# Patient Record
Sex: Female | Born: 1945 | Race: Black or African American | Hispanic: No | Marital: Married | Smoking: Former smoker
Health system: Southern US, Community
[De-identification: ages and names within clinical notes are randomized; demographics above are authoritative.]

## PROBLEM LIST (undated history)

## (undated) DIAGNOSIS — E785 Hyperlipidemia, unspecified: Secondary | ICD-10-CM

## (undated) DIAGNOSIS — T8859XA Other complications of anesthesia, initial encounter: Secondary | ICD-10-CM

## (undated) DIAGNOSIS — I739 Peripheral vascular disease, unspecified: Secondary | ICD-10-CM

## (undated) DIAGNOSIS — I10 Essential (primary) hypertension: Secondary | ICD-10-CM

## (undated) DIAGNOSIS — I6529 Occlusion and stenosis of unspecified carotid artery: Secondary | ICD-10-CM

## (undated) DIAGNOSIS — M1990 Unspecified osteoarthritis, unspecified site: Secondary | ICD-10-CM

## (undated) DIAGNOSIS — T4145XA Adverse effect of unspecified anesthetic, initial encounter: Secondary | ICD-10-CM

## (undated) DIAGNOSIS — Z8701 Personal history of pneumonia (recurrent): Secondary | ICD-10-CM

## (undated) HISTORY — PX: TONSILLECTOMY: SUR1361

## (undated) HISTORY — DX: Hyperlipidemia, unspecified: E78.5

## (undated) HISTORY — DX: Peripheral vascular disease, unspecified: I73.9

## (undated) HISTORY — PX: OOPHORECTOMY: SHX86

## (undated) HISTORY — PX: CYST REMOVAL HAND: SHX6279

## (undated) HISTORY — PX: CATARACT EXTRACTION W/ INTRAOCULAR LENS IMPLANT: SHX1309

## (undated) HISTORY — PX: ANKLE SURGERY: SHX546

## (undated) HISTORY — DX: Essential (primary) hypertension: I10

## (undated) HISTORY — DX: Occlusion and stenosis of unspecified carotid artery: I65.29

## (undated) HISTORY — PX: COLONOSCOPY: SHX174

## (undated) HISTORY — PX: APPENDECTOMY: SHX54

---

## 2003-12-29 ENCOUNTER — Ambulatory Visit (HOSPITAL_BASED_OUTPATIENT_CLINIC_OR_DEPARTMENT_OTHER): Admission: RE | Admit: 2003-12-29 | Discharge: 2003-12-29 | Payer: Self-pay | Admitting: Orthopedic Surgery

## 2003-12-29 ENCOUNTER — Ambulatory Visit (HOSPITAL_COMMUNITY): Admission: RE | Admit: 2003-12-29 | Discharge: 2003-12-29 | Payer: Self-pay | Admitting: Orthopedic Surgery

## 2010-10-18 ENCOUNTER — Ambulatory Visit (HOSPITAL_BASED_OUTPATIENT_CLINIC_OR_DEPARTMENT_OTHER)
Admission: RE | Admit: 2010-10-18 | Discharge: 2010-10-18 | Disposition: A | Discharge status: Home / Self Care | Payer: Commercial Indemnity | Source: Ambulatory Visit | Attending: Orthopedic Surgery | Admitting: Orthopedic Surgery

## 2010-10-18 DIAGNOSIS — M23329 Other meniscus derangements, posterior horn of medial meniscus, unspecified knee: Secondary | ICD-10-CM | POA: Insufficient documentation

## 2010-10-18 DIAGNOSIS — M2340 Loose body in knee, unspecified knee: Secondary | ICD-10-CM | POA: Insufficient documentation

## 2010-10-18 DIAGNOSIS — M23302 Other meniscus derangements, unspecified lateral meniscus, unspecified knee: Secondary | ICD-10-CM | POA: Insufficient documentation

## 2010-10-18 DIAGNOSIS — Z01812 Encounter for preprocedural laboratory examination: Secondary | ICD-10-CM | POA: Insufficient documentation

## 2010-10-18 DIAGNOSIS — M2240 Chondromalacia patellae, unspecified knee: Secondary | ICD-10-CM | POA: Insufficient documentation

## 2010-10-18 LAB — POCT HEMOGLOBIN-HEMACUE: Hemoglobin: 14.2 g/dL (ref 12.0–15.0)

## 2010-10-26 NOTE — Op Note (Signed)
NAME:  Sylvia Rodriguez, Sylvia Rodriguez NO.:  [id number]  MEDICAL RECORD NO.:  [id number]  LOCATION:                                 FACILITY:  PHYSICIAN:  Feliberto Gottron. Turner Daniels, M.D.        DATE OF BIRTH:  DATE OF PROCEDURE:  10/18/2010 DATE OF DISCHARGE:                              OPERATIVE REPORT   PREOPERATIVE DIAGNOSES:  Left knee meniscal tear, chondromalacia, and possible loose bodies.  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  PROCEDURE:  Partial arthroscopic medial greater than lateral meniscectomy, removal of multiple cartilaginous loose bodies and debridement chondromalacia grade 3 focal grade 4 with abrasion arthroplasty, medial femoral condyle and trochlea.  SURGEON:  Feliberto Gottron. Turner Daniels, MD  FIRST ASSISTANT:  Shirl Harris PA-C.  ANESTHETIC:  General LMA.  ESTIMATED BLOOD LOSS:  Minimal.  FLUID REPLACEMENT:  800 mL crystalloid.  DRAINS PLACED:  None.  TOURNIQUET TIME:  None.  INDICATIONS FOR PROCEDURE:  A 65 year old woman with catching, popping and pain in her left knee.  Plain radiographs, good maintenance of articular cartilage height does not look like she will need a knee arthroplasty.  She has failed conserve treatment, anti-inflammatory medicines, exercises, good temporary relief from a cortisone injection, but she once again has no fusion with pain along the medial and lateral joint lines and she reports catching, popping and buckling.  She desires elective arthroscopic evaluation and treatment of her left knee.  Risks and benefits of surgery have been discussed.  Questions answered.  DESCRIPTION OF PROCEDURE:  The patient identified by armband and taken to operating room #1 [facility].  Appropriate anesthetic monitors were attached.  General LMA anesthesia  induced with the patient in supine position.  Lateral post applied to the table and the left lower extremity was prepped and draped in usual sterile fashion from the ankle to the midthigh.  Time-out procedure performed.  Using a #11-blade standard inferomedial, inferolateral peripatellar portals were made allowing introduction of the arthroscope through the inferolateral portal pump pressure kept at 100 mmHg.  Diagnostic arthroscopy did indeed reveal multiple cartilaginous loose bodies in the joint fluid that was taken out with a sucker shaver or a small basket forceps. Moving into the patellofemoral joint flap tears of the trochlear were identified and debrided back to stable margins grade 3 to grade 4 chondromalacia.  The patella itself had grade 2-3 chondromalacia and was also lightly debrided.  Moving the medial compartment, identified complex tearing of the medial meniscus that was quite extensive and was debrided back to stable margin with a small biter, a large biter, upbiter and a 3.5 Gator sucker shaver.  The patient also had grade 3 to focal grade 4 chondromalacia with flap tears of the posterior and distal aspects of the medial femoral condyle and these were also smoothed and debrided to a stable margin.  The ACL and the PCL were intact.  On the lateral side, we found a couple more cartilaginous loose bodies that were taken through the outflow or with the sucker shaver.  The patient also had degenerative tearing of the lateral meniscus and this was debrided back to stable margin as well.  At this point, the knee was irrigated out with normal saline solution and we then instilled 3 mL of 0.5% Marcaine with epinephrine in the each portal subcutaneous tissue using a syringe and a 22 x 1/2-inch needle.  The rest of the 14 mL was then instilled into the joint itself as a local anesthetic.  Dressing of Xeroform, 4x4 dressing sponges, Webril and Ace wrap applied.  The patient was  awakened, extubated and taken to the recovery room without difficulty.     Feliberto Gottron. Turner Daniels, M.D.     Ovid Curd  D:  10/18/2010  T:  10/19/2010  Job:  [id number]  Electronically Signed by Gean Birchwood M.D. on 10/25/2010 11:10:15 PM

## 2015-11-10 LAB — ABI

## 2015-12-22 ENCOUNTER — Other Ambulatory Visit: Payer: Self-pay | Admitting: Vascular Surgery

## 2016-01-12 ENCOUNTER — Encounter: Payer: Self-pay | Admitting: Vascular Surgery

## 2016-01-19 ENCOUNTER — Ambulatory Visit (INDEPENDENT_AMBULATORY_CARE_PROVIDER_SITE_OTHER): Payer: BLUE CROSS/BLUE SHIELD | Admitting: Vascular Surgery

## 2016-01-19 ENCOUNTER — Encounter: Payer: Self-pay | Admitting: Vascular Surgery

## 2016-01-19 VITALS — BP 90/47 | HR 84 | Temp 97.8°F | Resp 16 | Ht 62.0 in | Wt 114.0 lb

## 2016-01-19 DIAGNOSIS — I70213 Atherosclerosis of native arteries of extremities with intermittent claudication, bilateral legs: Secondary | ICD-10-CM

## 2016-01-19 DIAGNOSIS — R0989 Other specified symptoms and signs involving the circulatory and respiratory systems: Secondary | ICD-10-CM

## 2016-01-19 NOTE — Progress Notes (Signed)
[facility] [place]  Patient name: Sylvia Rodriguez MRN: [id number] DOB: 08/18/1945 Sex: female  REASON FOR VISIT: Claudication, referred by Dr. Lollie Marrowaymond Rosario  HPI: Sylvia Mantislizabeth Jarchow is a 70 y.o. 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 She is active and likes to walk in the park, but her leg cramps have been limiting her walking distance. She states that this is her fearing with her daily activities. She also complains of bilateral thigh pain that she describes as a knife stabbing her in the leg. This occurs randomly at rest and also with getting out of bed. The started sometime in the past few months. When questioned, she does have buttock pain as well. She does not have low back pain or shooting pains down her leg. She denies any paresthesias. She denies any nonhealing wounds or rest pain.   There is no history of cardiac disease. She denies any chest pain or shortness of breath. She does describe an episode years ago where her left arm felt very heavy. She underwent a stroke workup was negative. She is a former smoker quitting 5 years ago. Her hypertension is managed on amlodipine. She takes Plavix daily. Her hyperlipidemia is managed on a statin. She is not diabetic.   SOCIAL HISTORY: Social History   Social History  . Marital status: Married    Spouse name: N/A  . Number of children: N/A  . Years of education: N/A   Occupational History  . Not on file.   Social History Main Topics  . Smoking status: Former Smoker    Quit date: 01/19/2011  . Smokeless tobacco: Never Used  . Alcohol use Not on file  . Drug use: Unknown  . Sexual activity: Not on file   Other Topics Concern  . Not on file   Social History Narrative  . No narrative on file    Allergies  Allergen Reactions  . Codeine   . Penicillins     Current Outpatient Prescriptions  Medication Sig Dispense Refill  . amLODipine (NORVASC)  5 MG tablet Take 5 mg by mouth daily.    Marland Kitchen. atorvastatin (LIPITOR) 40 MG tablet Take 40 mg by mouth daily.    . clopidogrel (PLAVIX) 75 MG tablet Take 75 mg by mouth daily.    . diazepam (VALIUM) 2 MG tablet Take 2 mg by mouth every 6 (six) hours as needed for anxiety.    . diclofenac sodium (VOLTAREN) 1 % GEL Apply topically daily as needed.    . doxycycline (VIBRAMYCIN) 100 MG capsule Take 100 mg by mouth 2 (two) times daily.    Marland Kitchen. omeprazole (PRILOSEC) 40 MG capsule Take 40 mg by mouth daily.    . pentoxifylline (TRENTAL) 400 MG CR tablet Take 400 mg by mouth 3 (three) times daily with meals.    . traMADol (ULTRAM) 50 MG tablet Take by mouth every 6 (six) hours as needed.     No current facility-administered medications for this visit.     REVIEW OF SYSTEMS:  [X]  denotes positive finding, [ ]  denotes negative finding Cardiac  Comments:  Chest pain or chest pressure:    Shortness of breath upon exertion:    Short of breath when lying flat:    Irregular heart rhythm:        Vascular    Pain in calf, thigh, or hip brought on by ambulation: x   Pain in feet at night that wakes you  up from your sleep:     Blood clot in your veins:    Leg swelling:         Pulmonary    Oxygen at home:    Productive cough:     Wheezing:         Neurologic    Sudden weakness in arms or legs:     Sudden numbness in arms or legs:     Sudden onset of difficulty speaking or slurred speech:    Temporary loss of vision in one eye:     Problems with dizziness:         Gastrointestinal    Blood in stool:     Vomited blood:         Genitourinary    Burning when urinating:     Blood in urine:        Psychiatric    Major depression:         Hematologic    Bleeding problems:    Problems with blood clotting too easily:        Skin    Rashes or ulcers:        Constitutional    Fever or chills:      PHYSICAL EXAM: Vitals:   01/19/16 1456  BP: (!) 90/47  Pulse: 84  Resp: 16  Temp: 97.8 F  (36.6 C)  TempSrc: Oral  SpO2: 99%  Weight: 114 lb (51.7 kg)  Height: 5\' 2"  (1.575 m)    GENERAL: The patient is a well-nourished female, in no acute distress. The vital signs are documented above. CARDIAC: There is a regular rate and rhythm. Right carotid bruit. VASCULAR:  Nonpalpable femoral pulses or pedal pulses. Feet appear warm and well perfused. PULMONARY: There is good air exchange bilaterally without wheezing or rales.  MUSCULOSKELETAL: There are no major deformities or cyanosis. NEUROLOGIC: No focal weakness or paresthesias are detected. SKIN: There are no ulcers or rashes noted. PSYCHIATRIC: The patient has a normal affect.  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   ABIs from [facility] on 11/10/2015 are 0.59 bilaterally.  MEDICAL ISSUES: Bilateral lower extremity claudication  The patient has had a one-year history of progressive bilateral calf claudication, left worse than right. 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 She states that her claudication is intolerable. Plan for an arteriogram with possible intervention. This will be scheduled in the near future with Dr. Randie Heinz. Discussed that she may require further surgery if percutaneous intervention is not successful.   Discussed that her bilateral thigh pain is unlikely related to blood flow issues given that the symptoms occur randomly and also at rest.  Right carotid bruit  Plan for carotid duplex at patient's convenience. She has been asymptomatic.  Maris Berger, PA-C [facility] [place] [phone]  Very active 70 year old with severely limiting bilateral lower extremity claudication. 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 Discussed option of arteriogram for further evaluation. Explained may be able to cross total occlusion and if so potentially could treat this with the endovascular means. Also explained the possible need for brachial artery puncture for full visualization of her arterial tree. Have scheduled this at her convenience with Dr. Randie Heinz as an outpatient I have examined the patient, reviewed and agree with  above.Gretta Began, MD 01/19/2016 4:37 PM

## 2016-01-20 ENCOUNTER — Other Ambulatory Visit: Payer: Self-pay

## 2016-01-27 ENCOUNTER — Ambulatory Visit (HOSPITAL_COMMUNITY)
Admission: RE | Admit: 2016-01-27 | Discharge: 2016-01-27 | Disposition: A | Discharge status: Home / Self Care | Payer: BLUE CROSS/BLUE SHIELD | Source: Ambulatory Visit | Attending: Vascular Surgery | Admitting: Vascular Surgery

## 2016-01-27 ENCOUNTER — Encounter (HOSPITAL_COMMUNITY)
Admission: RE | Disposition: A | Discharge status: Home / Self Care | Payer: Self-pay | Source: Ambulatory Visit | Attending: Vascular Surgery

## 2016-01-27 DIAGNOSIS — Z87891 Personal history of nicotine dependence: Secondary | ICD-10-CM | POA: Diagnosis not present

## 2016-01-27 DIAGNOSIS — I70213 Atherosclerosis of native arteries of extremities with intermittent claudication, bilateral legs: Secondary | ICD-10-CM | POA: Diagnosis not present

## 2016-01-27 DIAGNOSIS — Z7902 Long term (current) use of antithrombotics/antiplatelets: Secondary | ICD-10-CM | POA: Insufficient documentation

## 2016-01-27 HISTORY — PX: PERIPHERAL VASCULAR CATHETERIZATION: SHX172C

## 2016-01-27 LAB — POCT I-STAT, CHEM 8
BUN: 29 mg/dL — AB (ref 6–20)
CREATININE: 0.8 mg/dL (ref 0.44–1.00)
Calcium, Ion: 1.17 mmol/L (ref 1.15–1.40)
Chloride: 99 mmol/L — ABNORMAL LOW (ref 101–111)
GLUCOSE: 90 mg/dL (ref 65–99)
HEMATOCRIT: 37 % (ref 36.0–46.0)
Hemoglobin: 12.6 g/dL (ref 12.0–15.0)
POTASSIUM: 5 mmol/L (ref 3.5–5.1)
Sodium: 138 mmol/L (ref 135–145)
TCO2: 33 mmol/L (ref 0–100)

## 2016-01-27 SURGERY — ABDOMINAL AORTOGRAM W/LOWER EXTREMITY
Anesthesia: LOCAL

## 2016-01-27 MED ORDER — HEPARIN (PORCINE) IN NACL 2-0.9 UNIT/ML-% IJ SOLN
INTRAMUSCULAR | Status: AC
  Filled 2016-01-27: qty 500

## 2016-01-27 MED ORDER — IODIXANOL 320 MG/ML IV SOLN
INTRAVENOUS | Status: DC | PRN
  Administered 2016-01-27: 95 mL via INTRAVENOUS

## 2016-01-27 MED ORDER — FENTANYL CITRATE (PF) 100 MCG/2ML IJ SOLN
INTRAMUSCULAR | Status: AC
  Filled 2016-01-27: qty 2

## 2016-01-27 MED ORDER — ACETAMINOPHEN 325 MG PO TABS: 650.0000 mg | ORAL_TABLET | ORAL | Status: DC | PRN

## 2016-01-27 MED ORDER — LIDOCAINE HCL (PF) 1 % IJ SOLN
INTRAMUSCULAR | Status: DC | PRN
  Administered 2016-01-27 (×2): 22 mL

## 2016-01-27 MED ORDER — HEPARIN (PORCINE) IN NACL 2-0.9 UNIT/ML-% IJ SOLN
INTRAMUSCULAR | Status: DC | PRN
  Administered 2016-01-27: 1000 mL

## 2016-01-27 MED ORDER — SODIUM CHLORIDE 0.9 % IV SOLN: Freq: Once | INTRAVENOUS | Status: DC

## 2016-01-27 MED ORDER — LIDOCAINE HCL (PF) 1 % IJ SOLN
INTRAMUSCULAR | Status: AC
  Filled 2016-01-27: qty 30

## 2016-01-27 MED ORDER — MIDAZOLAM HCL 2 MG/2ML IJ SOLN
INTRAMUSCULAR | Status: DC | PRN
  Administered 2016-01-27: 1 mg via INTRAVENOUS

## 2016-01-27 MED ORDER — MIDAZOLAM HCL 2 MG/2ML IJ SOLN
INTRAMUSCULAR | Status: AC
  Filled 2016-01-27: qty 2

## 2016-01-27 MED ORDER — SODIUM CHLORIDE 0.9 % IV SOLN
INTRAVENOUS | Status: DC
  Administered 2016-01-27: 07:00:00 via INTRAVENOUS

## 2016-01-27 MED ORDER — FENTANYL CITRATE (PF) 100 MCG/2ML IJ SOLN
INTRAMUSCULAR | Status: DC | PRN
  Administered 2016-01-27: 50 ug via INTRAVENOUS
  Administered 2016-01-27 (×2): 25 ug via INTRAVENOUS

## 2016-01-27 SURGICAL SUPPLY — 13 items
CATH ANGIO 5F BER2 65CM (CATHETERS) ×2 IMPLANT
CATH CROSS OVER TEMPO 5F (CATHETERS) ×2 IMPLANT
CATH OMNI FLUSH 5F 65CM (CATHETERS) ×2 IMPLANT
DEVICE TORQUE .025-.038 (MISCELLANEOUS) ×2 IMPLANT
GUIDEWIRE ANGLED .035X150CM (WIRE) ×2 IMPLANT
KIT MICROINTRODUCER STIFF 5F (SHEATH) ×2 IMPLANT
KIT PV (KITS) ×2 IMPLANT
SHEATH PINNACLE 5F 10CM (SHEATH) ×4 IMPLANT
SYR MEDRAD MARK V 150ML (SYRINGE) ×2 IMPLANT
TRANSDUCER W/STOPCOCK (MISCELLANEOUS) ×2 IMPLANT
TRAY PV CATH (CUSTOM PROCEDURE TRAY) ×2 IMPLANT
WIRE BENTSON .035X145CM (WIRE) ×2 IMPLANT
WIRE TORQFLEX AUST .018X40CM (WIRE) ×2 IMPLANT

## 2016-01-27 NOTE — Op Note (Signed)
    Patient name: Sylvia Rodriguez MRN: [id number] DOB: [birth year] Sex: female  01/27/2016 Pre-operative Diagnosis: claudication Post-operative diagnosis:  Same Surgeon:  Lemar LivingsBrandon Malaiya Paczkowski Procedure Performed:  1.  US guided cannulation bilateral common femoral arteries  2.  Aortogram   Indications:   70 year old female former smoker on statin Plavix with bilateral lower extremity claudication left greater than right. 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 She has limitations to her daily activities and was therefore indicated for the above procedure with possible intervention.  Findings: The right external iliac artery was occluded for its entiretya patent common iliac artery on that side. The left common iliac artery was occluded. The bifurcation of the aorta was very narrowed. We were able to cross the occlusion on the right than on the left side there was dissection plane into the aorta and we therefore aborted. Completion aortogram demonstrated continued filling of the IMA and lumbar arteries with similar runoff to the groins as prior to attempted intervention.  Procedure:  The patient was identified in the holding area and taken to room 8.  The patient was then placed supine on the table and prepped and draped in the usual sterile fashion.  A time out was called.  Ultrasound was used to evaluate the right common femoral artery.  It was patent .  A digital ultrasound image was acquired.  A micropuncture needle was used to access the right common femoral artery under ultrasound guidance.  An 018 wire was advanced without resistance and a micropuncture sheath was placed.  The 018 wire was removed and a benson wire was placed.  The micropuncture sheath was exchanged for a 5 french sheath.  An omniflush catheter was advanced over the wire to the level of L-1.  An abdominal angiogram was obtained with findings of occluded right external iliac and  occluded left common iliac artery. With this in mind we then turned our attention towards the left groin. Ultrasound was used to identify the left common femoral artery using micropuncture needle and over a wire was passed and exchanged for 5 [place] sheath. From the left side we then used a combination of pericatheter Glidewire to cross the occlusion. Contrast demonstrated we had communication between the left and right also had dissection into the aorta. Given the patient's symptoms of claudication and young age I thought that she would be better served with aortobifemoral bypass if she needed intervention at this time. We therefore removed our catheter and wire from the left performed aortogram from the right with findings demonstrated above. Satisfied all catheters and wires were removed she is left in place discussion will be had regarding aortobifemoral bypass.   Contrast 95cc  Sedation: 52 minutes    Cherylanne Ardelean C. Randie Heinzain, MD [facility] [place] Office: [phone] Pager: [phone]

## 2016-01-27 NOTE — Discharge Instructions (Signed)
Angiogram, Care After °Refer to this sheet in the next few weeks. These instructions provide you with information about caring for yourself after your procedure. Your health care provider may also give you more specific instructions. Your treatment has been planned according to current medical practices, but problems sometimes occur. Call your health care provider if you have any problems or questions after your procedure. °WHAT TO EXPECT AFTER THE PROCEDURE °After your procedure, it is typical to have the following: °· Bruising at the catheter insertion site that usually fades within 1-2 weeks. °· Blood collecting in the tissue (hematoma) that may be painful to the touch. It should usually decrease in size and tenderness within 1-2 weeks. °HOME CARE INSTRUCTIONS °· Take medicines only as directed by your health care provider. °· You may shower 24-48 hours after the procedure or as directed by your health care provider. Remove the bandage (dressing) and gently wash the site with plain soap and water. Pat the area dry with a clean towel. Do not rub the site, because this may cause bleeding. °· Do not take baths, swim, or use a hot tub until your health care provider approves. °· Check your insertion site every day for redness, swelling, or drainage. °· Do not apply powder or lotion to the site. °· Do not lift over 10 lb (4.5 kg) for 5 days after your procedure or as directed by your health care provider. °· Ask your health care provider when it is okay to: °¨ Return to work or school. °¨ Resume usual physical activities or sports. °¨ Resume sexual activity. °· Do not drive home if you are discharged the same day as the procedure. Have someone else drive you. °· You may drive 24 hours after the procedure unless otherwise instructed by your health care provider. °· Do not operate machinery or power tools for 24 hours after the procedure or as directed by your health care provider. °· If your procedure was done as an  outpatient procedure, which means that you went home the same day as your procedure, a responsible adult should be with you for the first 24 hours after you arrive home. °· Keep all follow-up visits as directed by your health care provider. This is important. °SEEK MEDICAL CARE IF: °· You have a fever. °· You have chills. °· You have increased bleeding from the catheter insertion site. Hold pressure on the site.  CALL 911 °SEEK IMMEDIATE MEDICAL CARE IF: °· You have unusual pain at the catheter insertion site. °· You have redness, warmth, or swelling at the catheter insertion site. °· You have drainage (other than a small amount of blood on the dressing) from the catheter insertion site. °· The catheter insertion site is bleeding, and the bleeding does not stop after 30 minutes of holding steady pressure on the site. °· The area near or just beyond the catheter insertion site becomes pale, cool, tingly, or numb. °  °This information is not intended to replace advice given to you by your health care provider. Make sure you discuss any questions you have with your health care provider. °  °Document Released: 11/18/2004 Document Revised: 05/23/2014 Document Reviewed: 10/03/2012 °Elsevier Interactive Patient Education ©2016 [employer] ° °

## 2016-01-27 NOTE — H&P (Signed)
     History and Physical Update  The patient was interviewed and re-examined.  The patient's previous History and Physical has been reviewed and is unchanged from Dr. Bosie HelperEarly's office. Continue plan of aortogram with ble runoff and possible iliac artery stenting bilaterally, possibly from arm access.   Nayelis Bonito C. Randie Heinzain, MD [facility] [place] Office: [phone] Pager: [phone]   01/27/2016, 8:14 AM

## 2016-01-27 NOTE — Progress Notes (Signed)
Site area: lt groin sheath pulled by Army MeliaLaura murphy Site Prior to Removal:  Level  0 Pressure Applied For:  20 minutes Manual:   yes Patient Status During Pull:  stable Post Pull Site:  Level  0 Post Pull Instructions Given:  yes Post Pull Pulses Present: yes Dressing Applied:  tegaderm Bedrest begins @   1035 Comments:

## 2016-01-27 NOTE — Progress Notes (Signed)
Site area: rt groin fa sheath Site Prior to Removal:  Level 0 Pressure Applied For:  20 minutes Manual:   yes Patient Status During Pull:  stable Post Pull Site:  Level  0 Post Pull Instructions Given:  yes Post Pull Pulses Present: yes Dressing Applied:  tegaderm Bedrest begins @  Comments:   

## 2016-01-28 ENCOUNTER — Encounter (HOSPITAL_COMMUNITY): Payer: Self-pay | Admitting: Vascular Surgery

## 2016-02-09 ENCOUNTER — Encounter: Payer: Self-pay | Admitting: Family Medicine

## 2016-02-19 ENCOUNTER — Ambulatory Visit (INDEPENDENT_AMBULATORY_CARE_PROVIDER_SITE_OTHER): Payer: BLUE CROSS/BLUE SHIELD | Admitting: Cardiovascular Disease

## 2016-02-19 ENCOUNTER — Encounter: Payer: Self-pay | Admitting: Cardiovascular Disease

## 2016-02-19 VITALS — Ht 62.0 in | Wt 112.8 lb

## 2016-02-19 DIAGNOSIS — I10 Essential (primary) hypertension: Secondary | ICD-10-CM | POA: Diagnosis not present

## 2016-02-19 DIAGNOSIS — R0989 Other specified symptoms and signs involving the circulatory and respiratory systems: Secondary | ICD-10-CM

## 2016-02-19 DIAGNOSIS — I739 Peripheral vascular disease, unspecified: Secondary | ICD-10-CM

## 2016-02-19 DIAGNOSIS — E7800 Pure hypercholesterolemia, unspecified: Secondary | ICD-10-CM | POA: Diagnosis not present

## 2016-02-19 DIAGNOSIS — E785 Hyperlipidemia, unspecified: Secondary | ICD-10-CM

## 2016-02-19 HISTORY — DX: Hyperlipidemia, unspecified: E78.5

## 2016-02-19 HISTORY — DX: Peripheral vascular disease, unspecified: I73.9

## 2016-02-19 HISTORY — DX: Essential (primary) hypertension: I10

## 2016-02-19 NOTE — Patient Instructions (Addendum)
Medication Instructions:  Your physician has recommended you make the following change in your medication:  1. STOP Amlodipine 2. STOP Lisinopril  Labwork: No new orders.   Please have your primary care provider fax a copy of your lab work to our office, fax [phone]  Testing/Procedures: Your physician has requested that you have a carotid duplex. This test is an ultrasound of the carotid arteries in your neck. It looks at blood flow through these arteries that supply the brain with blood. Allow one hour for this exam. There are no restrictions or special instructions.  Follow-Up: Your physician recommends that you schedule a follow-up appointment in: 3 MONTHS with Dr Duke Salviaandolph   Any Other Special Instructions Will Be Listed Below (If Applicable).  Your physician has requested that you regularly monitor and record your blood pressure readings at home. Please use the same machine at the same time of day to check your readings and record them to bring to your follow-up visit. Please contact the office if your BP is consistently greater than 140/90.   You are at low risk from a cardiac standpoint to proceed with vascular surgery.  You are okay to hold Plavix 3-5 days prior to surgery per the surgeons discretion.      If you need a refill on your cardiac medications before your next appointment, please call your pharmacy.

## 2016-02-19 NOTE — Progress Notes (Signed)
Cardiology Office Note   Date:  02/19/2016   ID:  Sylvia Rodriguez, DOB 1945/11/29, MRN [id number]  PCP:  Doreen Salvageonald Bulla, PA-C  Cardiologist:   Chilton Siiffany Haviland, MD   Chief Complaint  Patient presents with  . New Patient (Initial Visit)    surgical clearance      History of Present Illness: Sylvia Rodriguez is a 70 y.o. female with hypertension, hyperlipidemia, and PAD who presents for pre-surgical risk assessment.  Sylvia Rodriguez reported claudication and had a lower extremity arteriogram on 01/27/16.  She was found to have an occluded right external iliac artery and left common iliac artery.  Given her age, she was referred for aorto bifemoral bypass and presents today for presurgical risk assessment. Overall she has been feeling well. She notes claudication with walking. Several years ago she will use to walk for a mile daily but stopped due to the claudication. She has never had exertional chest pain or shortness of breath. Her only complaint is intermittent episodes of dizziness. This typically occurs when she works. 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  Her symptoms quickly improved when sitting down and with drinking water. She also notes that she does not eat and drink well throughout the day, and she is frequently too busy.  She reports unintentional weight loss, though she has mad several improvements to her diet.  She denies any melena or hematochezia. She is up-to-date on colonoscopies but notes that she is due for mammogram. She was previous on higher doses of atorvastatin but it was reduced due to arm myalgias.  She has not noted any lower extremity edema, orthopnea, or PND.   Past Medical History:  Diagnosis Date  . Essential hypertension 02/19/2016  . Hyperlipidemia 02/19/2016  . Peripheral arterial disease (HCC) 02/19/2016    Past Surgical History:  Procedure Laterality Date  . PERIPHERAL  VASCULAR CATHETERIZATION N/A 01/27/2016   Procedure: Abdominal Aortogram w/Lower Extremity;  Surgeon: Maeola HarmanBrandon Christopher Cain, MD;  [facility];  Service: Cardiovascular;  Laterality: N/A;     Current Outpatient Prescriptions  Medication Sig Dispense Refill  . atorvastatin (LIPITOR) 10 MG tablet Take 10 mg by mouth daily.    . cilostazol (PLETAL) 100 MG tablet Take 100 mg by mouth 2 (two) times daily.    . clopidogrel (PLAVIX) 75 MG tablet Take 75 mg by mouth daily.    . diazepam (VALIUM) 2 MG tablet Take 2 mg by mouth every 6 (six) hours as needed for muscle spasms.     . diclofenac sodium (VOLTAREN) 1 % GEL Apply 2 g topically daily as needed (shoulder pain).     . Multiple Vitamins-Minerals (TH COMPLETE MULTI PO) Take 30 mLs by mouth daily as needed.    Marland Kitchen. omeprazole (PRILOSEC) 40 MG capsule Take 40 mg by mouth daily.    . pentoxifylline (TRENTAL) 400 MG CR tablet Take 400 mg by mouth daily.     . traMADol (ULTRAM) 50 MG tablet Take 50 mg by mouth every 6 (six) hours as needed for moderate pain.      No current facility-administered medications for this visit.     Allergies:   Aspirin; Codeine; and Penicillins    Social History:  The patient  reports that she quit smoking about 5 years ago. She has never used smokeless tobacco. She reports that she does not drink alcohol or use drugs.  Family History:  The patient's family history includes Diabetes in her mother; Heart attack in her maternal uncle and mother; Stroke in her maternal aunt; Valvular heart disease in her mother.    ROS:  Please see the history of present illness.   Otherwise, review of systems are positive for none.   All other systems are reviewed and negative.    PHYSICAL EXAM: VS:  Ht 5\' 2"  (1.575 m)   Wt 112 lb 12.8 oz (51.2 kg)   BMI 20.63 kg/m  , BMI Body mass index is 20.63 kg/m. GENERAL:  Well appearing HEENT:  Pupils equal round and reactive, fundi not visualized, oral mucosa  unremarkable NECK:  No jugular venous distention, waveform within normal limits, carotid upstroke brisk and symmetric, R carotid bruit, no thyromegaly LYMPHATICS:  No cervical adenopathy LUNGS:  Clear to auscultation bilaterally HEART:  RRR.  PMI not displaced or sustained,S1 and S2 within normal limits, no S3, no S4, no clicks, no rubs, no murmurs ABD:  Flat, positive bowel sounds normal in frequency in pitch, no bruits, no rebound, no guarding, no midline pulsatile mass, no hepatomegaly, no splenomegaly EXT:  2 plus pulses throughout, no edema, no cyanosis no clubbing SKIN:  No rashes no nodules NEURO:  Cranial nerves II through XII grossly intact, motor grossly intact throughout PSYCH:  Cognitively intact, oriented to person place and time   EKG:  EKG is not ordered today. The ekg ordered 01/27/16 demonstrates sinus rhythm rate 73 bpm.    Recent Labs: 01/27/2016: BUN 29; Creatinine, Ser 0.80; Hemoglobin 12.6; Potassium 5.0; Sodium 138    Lipid Panel No results found for: CHOL, TRIG, HDL, CHOLHDL, VLDL, LDLCALC, LDLDIRECT    Wt Readings from Last 3 Encounters:  02/19/16 112 lb 12.8 oz (51.2 kg)  01/27/16 114 lb (51.7 kg)  01/19/16 114 lb (51.7 kg)      ASSESSMENT AND PLAN:  # Pre-surgical risk assessment: Sylvia Rodriguez is able to walk up a flight of stairs without having chest pain or shortness of breath.  She can't walk for long distances due to claudication but doesn't have chest pain.  The patient does not have any unstable cardiac conditions.  Upon evaluation today, she can achieve 4 METs or greater without anginal symptoms.    Our service is available as necessary in the perioperative period.  # Hyperlipidemia: Her PCP reduced the atorvastatin which helped her myalgias. She is scheduled to get labs drawn this week.  If her lipids are above goal we will refer her to lipid clinic for consideration of a PCSK9 inhibitor.    # Hypertension: Sylvia Rodriguez complains of dizziness and her  blood pressure is consistently in the 90s/40s.  We will hold lisinopril and amlodipine.  She will check her BP regularly and call if it is >140/90.   # Bruit: Sylvia Rodriguez was noted to have a bruit in her right carotid. We will refer her for carotid Dopplers.  Current medicines are reviewed at length with the patient today.  The patient does not have concerns regarding medicines.  The following changes have been made:  Stop amlodipine and lisinopril.  Labs/ tests ordered today include:  No orders of the defined types were placed in this encounter.    Disposition:   FU with Amorie Rentz C. Duke Salvia, MD, [facility] in 3 months.     This note was written with the assistance of speech recognition software.  Please excuse any transcriptional errors.  Signed, Darrel Baroni C. Duke Salvia, MD, [facility]  02/19/2016 6:08 PM    [facility]

## 2016-02-25 ENCOUNTER — Ambulatory Visit (HOSPITAL_COMMUNITY)
Admission: RE | Admit: 2016-02-25 | Discharge: 2016-02-25 | Disposition: A | Discharge status: Home / Self Care | Payer: BLUE CROSS/BLUE SHIELD | Source: Ambulatory Visit | Attending: Cardiovascular Disease | Admitting: Cardiovascular Disease

## 2016-02-25 ENCOUNTER — Encounter: Payer: Self-pay | Admitting: Vascular Surgery

## 2016-02-25 DIAGNOSIS — R0989 Other specified symptoms and signs involving the circulatory and respiratory systems: Secondary | ICD-10-CM

## 2016-02-25 DIAGNOSIS — I6523 Occlusion and stenosis of bilateral carotid arteries: Secondary | ICD-10-CM | POA: Diagnosis not present

## 2016-03-01 ENCOUNTER — Ambulatory Visit (INDEPENDENT_AMBULATORY_CARE_PROVIDER_SITE_OTHER): Payer: BLUE CROSS/BLUE SHIELD | Admitting: Vascular Surgery

## 2016-03-01 ENCOUNTER — Other Ambulatory Visit: Payer: Self-pay

## 2016-03-01 ENCOUNTER — Encounter: Payer: Self-pay | Admitting: Vascular Surgery

## 2016-03-01 VITALS — BP 113/67 | HR 77 | Temp 97.1°F | Resp 16 | Ht 62.0 in | Wt 115.9 lb

## 2016-03-01 DIAGNOSIS — I70213 Atherosclerosis of native arteries of extremities with intermittent claudication, bilateral legs: Secondary | ICD-10-CM

## 2016-03-01 NOTE — Progress Notes (Signed)
Vascular and Vein Specialist of [place]  Patient name: Sylvia Rodriguez MRN: [id number] DOB: [birth year] Sex: female  REASON FOR VISIT: Today for discussion of her lower extremity arterial insufficiency.   HPI: Sylvia Rodriguez is a 70 y.o. female history like claudication and underwent outpatient arteriogram on 02/01/2016. 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 She reports that she is severely limited by this. She is extremely active and is not able to tolerate this level, claudication. Expanded she does not have any good endovascular treatment options for this. She did see Dr. Duke Salvia for cardiac clearance and was felt to be an acceptable risk for aortic surgery.  Past Medical History:  Diagnosis Date  . Essential hypertension 02/19/2016  . Hyperlipidemia 02/19/2016  . Peripheral arterial disease (HCC) 02/19/2016    Family History  Problem Relation Age of Onset  . Diabetes Mother   . Heart attack Mother   . Valvular heart disease Mother   . Stroke Maternal Aunt   . Heart attack Maternal Uncle     SOCIAL HISTORY: Social History  Substance Use Topics  . Smoking status: Former Smoker    Quit date: 01/19/2011  . Smokeless tobacco: Never Used  . Alcohol use No    Allergies  Allergen Reactions  . Aspirin Other (See Comments)    Pt sick on stomach   . Codeine Other (See Comments)    Bone Pain  . Penicillins Hives    Has patient had a PCN reaction causing immediate rash, facial/tongue/throat swelling, SOB or lightheadedness with hypotension: Yes Has patient had a PCN reaction causing severe rash involving mucus membranes or skin necrosis: No Has patient had a PCN reaction that required hospitalization No Has patient had a PCN reaction occurring within the last 10 years: No If all of the above answers are "NO", then  may proceed with Cephalosporin use.      Current Outpatient Prescriptions  Medication Sig Dispense Refill  . atorvastatin (LIPITOR) 10 MG tablet Take 10 mg by mouth daily.    . cilostazol (PLETAL) 100 MG tablet Take 100 mg by mouth 2 (two) times daily.    . clopidogrel (PLAVIX) 75 MG tablet Take 75 mg by mouth daily.    . diazepam (VALIUM) 2 MG tablet Take 2 mg by mouth every 6 (six) hours as needed for muscle spasms.     . diclofenac sodium (VOLTAREN) 1 % GEL Apply 2 g topically daily as needed (shoulder pain).     . Multiple Vitamins-Minerals (TH COMPLETE MULTI PO) Take 30 mLs by mouth daily as needed.    Marland Kitchen omeprazole (PRILOSEC) 40 MG capsule Take 40 mg by mouth daily.    . pentoxifylline (TRENTAL) 400 MG CR tablet Take 400 mg by mouth daily.     . traMADol (ULTRAM) 50 MG tablet Take 50 mg by mouth every 6 (six) hours as needed for moderate pain.      No current facility-administered medications for this visit.     REVIEW OF SYSTEMS:  [X]  denotes positive finding, [ ]  denotes negative finding Cardiac  Comments:  Chest pain or chest pressure:    Shortness of breath upon exertion:    Short of breath when lying flat:    Irregular heart rhythm:        Vascular    Pain in calf, thigh, or hip brought on by ambulation: x   Pain  in feet at night that wakes you up from your sleep:     Blood clot in your veins:    Leg swelling:           PHYSICAL EXAM: Vitals:   03/01/16 0826  BP: 113/67  Pulse: 77  Resp: 16  Temp: 97.1 F (36.2 C)  TempSrc: Oral  SpO2: 100%  Weight: 115 lb 14.4 oz (52.6 kg)  Height: 5\' 2"  (1.575 m)    GENERAL: The patient is a well-nourished female, in no acute distress. The vital signs are documented above. CARDIOVASCULAR: Absent pedal pulses bilaterally PULMONARY: There is good air exchange  MUSCULOSKELETAL: There are no major deformities or cyanosis. NEUROLOGIC: No focal weakness or paresthesias are detected. SKIN: There are no ulcers or rashes  noted. PSYCHIATRIC: The patient has a normal affect.  DATA:  Reviewed her care gram from 01/27/2016  MEDICAL ISSUES: Had long discussion with patient regarding the magnitude of aortobifemoral bypass. Explained the expected 1-2 days of intensive care and proximally 5-7 day hospitalization. Splane the expected the prolonged recovery time to get back to her baseline. I did explain that this was not limb threatening. She is on able to tolerate this degree of claudication wish to proceed with surgery. Her husband is a Naval architecttruck driver and we'll coordinate this around time which would be available for him to help care for her post discharge.    Larina Earthlyodd F. Lesean Woolverton, MD FACS [facility] Office Tel [phone] Pager [phone]

## 2016-03-03 ENCOUNTER — Encounter (HOSPITAL_COMMUNITY): Payer: BLUE CROSS/BLUE SHIELD

## 2016-03-24 NOTE — Pre-Procedure Instructions (Signed)
Sylvia Rodriguez  03/24/2016      [id number] - [place], [place] - [id number] [place] [id number] Phone: [phone] Fax: [phone]    Your procedure is scheduled on Monday November 20.  [facility] at 5:30 A.M.  Call this number if you have problems the morning of surgery:  [phone]   Remember:  Do not eat food or drink liquids after midnight.  Take these medicines the morning of surgery with A SIP OF WATER: diazepam (Valium) if needed, omeprazole (prilosec), tramadol (ultram) if needed, cilostazol (Pletal), pentoxifylline (Trental)  7 days prior to surgery STOP taking any diclofenac (Voltaren) gel,  Aspirin, Aleve, Naproxen, Ibuprofen, Motrin, Advil, Goody's, BC's, all herbal medications, fish oil, and all vitamins  STOP taking Plavix 5 days prior to surgery (last dose on 03/29/16)      Do not wear jewelry, make-up or nail polish.  Do not wear lotions, powders, or perfumes, or deoderant.  Do not shave 48 hours prior to surgery.  Men may shave face and neck.  Do not bring valuables to the hospital.  [facility] is not responsible for any belongings or valuables.  Contacts, dentures or bridgework may not be worn into surgery.  Leave your suitcase in the car.  After surgery it may be brought to your room.  For patients admitted to the hospital, discharge time will be determined by your treatment team.  Patients discharged the day of surgery will not be allowed to drive home.    Special instructions:    [place]- Preparing For Surgery  Before surgery, you can play an important role. Because skin is not sterile, your skin needs to be as free of germs as possible. You can reduce the number of germs on your skin by washing with CHG (chlorahexidine gluconate) Soap before surgery.  CHG is an antiseptic cleaner which kills germs and bonds with the skin to continue killing germs even after washing.  Please  do not use if you have an allergy to CHG or antibacterial soaps. If your skin becomes reddened/irritated stop using the CHG.  Do not shave (including legs and underarms) for at least 48 hours prior to first CHG shower. It is OK to shave your face.  Please follow these instructions carefully.   1. Shower the NIGHT BEFORE SURGERY and the MORNING OF SURGERY with CHG.   2. If you chose to wash your hair, wash your hair first as usual with your normal shampoo.  3. After you shampoo, rinse your hair and body thoroughly to remove the shampoo.  4. Use CHG as you would any other liquid soap. You can apply CHG directly to the skin and wash gently with a scrungie or a clean washcloth.   5. Apply the CHG Soap to your body ONLY FROM THE NECK DOWN.  Do not use on open wounds or open sores. Avoid contact with your eyes, ears, mouth and genitals (private parts). Wash genitals (private parts) with your normal soap.  6. Wash thoroughly, paying special attention to the area where your surgery will be performed.  7. Thoroughly rinse your body with warm water from the neck down.  8. DO NOT shower/wash with your normal soap after using and rinsing off the CHG Soap.  9. Pat yourself dry with a CLEAN TOWEL.   10. Wear CLEAN PAJAMAS   11. Place CLEAN SHEETS on your bed the night of your first shower and  DO NOT SLEEP WITH PETS.    Day of Surgery: Do not apply any deodorants/lotions. Please wear clean clothes to the hospital/surgery center.      Please read over the following fact sheets that you were given. MRSA Information

## 2016-03-25 ENCOUNTER — Encounter (HOSPITAL_COMMUNITY)
Admission: RE | Admit: 2016-03-25 | Discharge: 2016-03-25 | Disposition: A | Discharge status: Home / Self Care | Payer: BLUE CROSS/BLUE SHIELD | Source: Ambulatory Visit | Attending: Vascular Surgery | Admitting: Vascular Surgery

## 2016-03-25 ENCOUNTER — Encounter (HOSPITAL_COMMUNITY): Payer: Self-pay | Admitting: Urology

## 2016-03-25 DIAGNOSIS — I739 Peripheral vascular disease, unspecified: Secondary | ICD-10-CM | POA: Insufficient documentation

## 2016-03-25 DIAGNOSIS — Z01812 Encounter for preprocedural laboratory examination: Secondary | ICD-10-CM | POA: Insufficient documentation

## 2016-03-25 DIAGNOSIS — I10 Essential (primary) hypertension: Secondary | ICD-10-CM | POA: Diagnosis not present

## 2016-03-25 DIAGNOSIS — E785 Hyperlipidemia, unspecified: Secondary | ICD-10-CM | POA: Insufficient documentation

## 2016-03-25 HISTORY — DX: Other complications of anesthesia, initial encounter: T88.59XA

## 2016-03-25 HISTORY — DX: Adverse effect of unspecified anesthetic, initial encounter: T41.45XA

## 2016-03-25 HISTORY — DX: Unspecified osteoarthritis, unspecified site: M19.90

## 2016-03-25 HISTORY — DX: Personal history of pneumonia (recurrent): Z87.01

## 2016-03-25 LAB — COMPREHENSIVE METABOLIC PANEL
ALK PHOS: 56 U/L (ref 38–126)
ALT: 13 U/L — AB (ref 14–54)
ANION GAP: 10 (ref 5–15)
AST: 20 U/L (ref 15–41)
Albumin: 3.9 g/dL (ref 3.5–5.0)
BUN: 14 mg/dL (ref 6–20)
CALCIUM: 9.2 mg/dL (ref 8.9–10.3)
CHLORIDE: 105 mmol/L (ref 101–111)
CO2: 25 mmol/L (ref 22–32)
CREATININE: 0.82 mg/dL (ref 0.44–1.00)
Glucose, Bld: 87 mg/dL (ref 65–99)
Potassium: 4 mmol/L (ref 3.5–5.1)
SODIUM: 140 mmol/L (ref 135–145)
Total Bilirubin: 0.8 mg/dL (ref 0.3–1.2)
Total Protein: 6.5 g/dL (ref 6.5–8.1)

## 2016-03-25 LAB — CBC
HCT: 36.9 % (ref 36.0–46.0)
Hemoglobin: 12.1 g/dL (ref 12.0–15.0)
MCH: 28.6 pg (ref 26.0–34.0)
MCHC: 32.8 g/dL (ref 30.0–36.0)
MCV: 87.2 fL (ref 78.0–100.0)
PLATELETS: 240 10*3/uL (ref 150–400)
RBC: 4.23 MIL/uL (ref 3.87–5.11)
RDW: 14.4 % (ref 11.5–15.5)
WBC: 5.3 10*3/uL (ref 4.0–10.5)

## 2016-03-25 LAB — TYPE AND SCREEN
ABO/RH(D): O POS
Antibody Screen: NEGATIVE

## 2016-03-25 LAB — SURGICAL PCR SCREEN
MRSA, PCR: NEGATIVE
STAPHYLOCOCCUS AUREUS: NEGATIVE

## 2016-03-25 LAB — BLOOD GAS, ARTERIAL
Acid-Base Excess: 3.5 mmol/L — ABNORMAL HIGH (ref 0.0–2.0)
BICARBONATE: 27.4 mmol/L (ref 20.0–28.0)
Drawn by: 242311
FIO2: 21
O2 SAT: 96 %
PATIENT TEMPERATURE: 98.6
PCO2 ART: 40.9 mmHg (ref 32.0–48.0)
PO2 ART: 82.1 mmHg — AB (ref 83.0–108.0)
pH, Arterial: 7.44 (ref 7.350–7.450)

## 2016-03-25 LAB — URINALYSIS, ROUTINE W REFLEX MICROSCOPIC
BILIRUBIN URINE: NEGATIVE
GLUCOSE, UA: NEGATIVE mg/dL
HGB URINE DIPSTICK: NEGATIVE
KETONES UR: NEGATIVE mg/dL
Leukocytes, UA: NEGATIVE
Nitrite: NEGATIVE
PROTEIN: NEGATIVE mg/dL
Specific Gravity, Urine: 1.005 (ref 1.005–1.030)
pH: 6.5 (ref 5.0–8.0)

## 2016-03-25 LAB — PROTIME-INR
INR: 1.03
PROTHROMBIN TIME: 13.6 s (ref 11.4–15.2)

## 2016-03-25 LAB — APTT: APTT: 30 s (ref 24–36)

## 2016-03-25 LAB — ABO/RH: ABO/RH(D): O POS

## 2016-03-25 MED ORDER — CHLORHEXIDINE GLUCONATE CLOTH 2 % EX PADS: 6.0000 | MEDICATED_PAD | Freq: Once | CUTANEOUS | Status: DC

## 2016-03-25 NOTE — Progress Notes (Signed)
PCP: Dr. Jacqlyn LarsenBulla Cardiologist: [facility], stress test and EKG requested from Dr. Hanley Haysosario, pt states she had some of these tests done and everything looked good, pt denies cardiac history.   EKG: 01/27/16  Spoke with Judeth CornfieldStephanie at Dr. Bosie HelperEarly's office. Pt to stop Plavix 5 days prior to surgery (last dose 03/29/16), pt to contiue Trental and Pletal. Pt given these instructions and verbalized understanding.   No complaints of chest pain, SOB or signs of infection at PAT appointment.

## 2016-04-03 MED ORDER — VANCOMYCIN HCL IN DEXTROSE 1-5 GM/200ML-% IV SOLN
1000.0000 mg | INTRAVENOUS | Status: AC
  Administered 2016-04-04: 1000 mg via INTRAVENOUS
  Filled 2016-04-03: qty 200

## 2016-04-03 MED ORDER — SODIUM CHLORIDE 0.9 % IV SOLN: INTRAVENOUS | Status: DC

## 2016-04-04 ENCOUNTER — Encounter (HOSPITAL_COMMUNITY)
Admission: RE | Disposition: A | Discharge status: Home / Self Care | Payer: Self-pay | Source: Ambulatory Visit | Attending: Vascular Surgery

## 2016-04-04 ENCOUNTER — Inpatient Hospital Stay (HOSPITAL_COMMUNITY): Payer: BLUE CROSS/BLUE SHIELD | Admitting: Certified Registered Nurse Anesthetist

## 2016-04-04 ENCOUNTER — Inpatient Hospital Stay (HOSPITAL_COMMUNITY)
Admission: RE | Admit: 2016-04-04 | Discharge: 2016-04-10 | DRG: 271 | Disposition: A | Discharge status: Home / Self Care | Payer: BLUE CROSS/BLUE SHIELD | Source: Ambulatory Visit | Attending: Vascular Surgery | Admitting: Vascular Surgery

## 2016-04-04 ENCOUNTER — Inpatient Hospital Stay (HOSPITAL_COMMUNITY): Payer: BLUE CROSS/BLUE SHIELD

## 2016-04-04 ENCOUNTER — Encounter (HOSPITAL_COMMUNITY): Payer: Self-pay | Admitting: Certified Registered Nurse Anesthetist

## 2016-04-04 DIAGNOSIS — E876 Hypokalemia: Secondary | ICD-10-CM | POA: Diagnosis not present

## 2016-04-04 DIAGNOSIS — Z87891 Personal history of nicotine dependence: Secondary | ICD-10-CM | POA: Diagnosis not present

## 2016-04-04 DIAGNOSIS — Z885 Allergy status to narcotic agent status: Secondary | ICD-10-CM

## 2016-04-04 DIAGNOSIS — E785 Hyperlipidemia, unspecified: Secondary | ICD-10-CM | POA: Diagnosis present

## 2016-04-04 DIAGNOSIS — Z79891 Long term (current) use of opiate analgesic: Secondary | ICD-10-CM | POA: Diagnosis not present

## 2016-04-04 DIAGNOSIS — I7409 Other arterial embolism and thrombosis of abdominal aorta: Secondary | ICD-10-CM | POA: Diagnosis present

## 2016-04-04 DIAGNOSIS — Z7902 Long term (current) use of antithrombotics/antiplatelets: Secondary | ICD-10-CM

## 2016-04-04 DIAGNOSIS — I70213 Atherosclerosis of native arteries of extremities with intermittent claudication, bilateral legs: Principal | ICD-10-CM | POA: Diagnosis present

## 2016-04-04 DIAGNOSIS — G4730 Sleep apnea, unspecified: Secondary | ICD-10-CM | POA: Diagnosis present

## 2016-04-04 DIAGNOSIS — Z880 Allergy status to penicillin: Secondary | ICD-10-CM | POA: Diagnosis not present

## 2016-04-04 DIAGNOSIS — R110 Nausea: Secondary | ICD-10-CM | POA: Diagnosis not present

## 2016-04-04 DIAGNOSIS — Z79899 Other long term (current) drug therapy: Secondary | ICD-10-CM

## 2016-04-04 DIAGNOSIS — Z886 Allergy status to analgesic agent status: Secondary | ICD-10-CM | POA: Diagnosis not present

## 2016-04-04 DIAGNOSIS — E872 Acidosis: Secondary | ICD-10-CM | POA: Diagnosis present

## 2016-04-04 DIAGNOSIS — M1990 Unspecified osteoarthritis, unspecified site: Secondary | ICD-10-CM | POA: Diagnosis present

## 2016-04-04 DIAGNOSIS — Z95828 Presence of other vascular implants and grafts: Secondary | ICD-10-CM

## 2016-04-04 DIAGNOSIS — I10 Essential (primary) hypertension: Secondary | ICD-10-CM | POA: Diagnosis present

## 2016-04-04 DIAGNOSIS — R262 Difficulty in walking, not elsewhere classified: Secondary | ICD-10-CM

## 2016-04-04 DIAGNOSIS — D62 Acute posthemorrhagic anemia: Secondary | ICD-10-CM | POA: Diagnosis not present

## 2016-04-04 HISTORY — PX: AORTA - BILATERAL FEMORAL ARTERY BYPASS GRAFT: SHX1175

## 2016-04-04 LAB — BASIC METABOLIC PANEL
Anion gap: 7 (ref 5–15)
BUN: 13 mg/dL (ref 6–20)
CALCIUM: 8.3 mg/dL — AB (ref 8.9–10.3)
CO2: 26 mmol/L (ref 22–32)
CREATININE: 0.75 mg/dL (ref 0.44–1.00)
Chloride: 104 mmol/L (ref 101–111)
GFR calc Af Amer: >60 mL/min (ref >60)
GLUCOSE: 219 mg/dL — AB (ref 65–99)
POTASSIUM: 3.4 mmol/L — AB (ref 3.5–5.1)
SODIUM: 137 mmol/L (ref 135–145)

## 2016-04-04 LAB — BLOOD GAS, ARTERIAL
ACID-BASE EXCESS: 3 mmol/L — AB (ref 0.0–2.0)
BICARBONATE: 28.1 mmol/L — AB (ref 20.0–28.0)
FIO2: 0.21
O2 SAT: 97.3 %
PCO2 ART: 50.1 mmHg — AB (ref 32.0–48.0)
PH ART: 7.364 (ref 7.350–7.450)
PO2 ART: 105 mmHg (ref 83.0–108.0)
Patient temperature: 97.3

## 2016-04-04 LAB — PROTIME-INR
INR: 1.25
Prothrombin Time: 15.8 seconds — ABNORMAL HIGH (ref 11.4–15.2)

## 2016-04-04 LAB — CBC
HCT: 31.9 % — ABNORMAL LOW (ref 36.0–46.0)
Hemoglobin: 10.8 g/dL — ABNORMAL LOW (ref 12.0–15.0)
MCH: 29.4 pg (ref 26.0–34.0)
MCHC: 33.9 g/dL (ref 30.0–36.0)
MCV: 86.9 fL (ref 78.0–100.0)
PLATELETS: 142 10*3/uL — AB (ref 150–400)
RBC: 3.67 MIL/uL — AB (ref 3.87–5.11)
RDW: 14.9 % (ref 11.5–15.5)
WBC: 8.8 10*3/uL (ref 4.0–10.5)

## 2016-04-04 LAB — APTT: aPTT: 28 seconds (ref 24–36)

## 2016-04-04 LAB — MAGNESIUM: Magnesium: 1.3 mg/dL — ABNORMAL LOW (ref 1.7–2.4)

## 2016-04-04 SURGERY — CREATION, BYPASS, ARTERIAL, AORTA TO FEMORAL, BILATERAL, USING GRAFT
Anesthesia: General | Laterality: Bilateral

## 2016-04-04 MED ORDER — ENOXAPARIN SODIUM 40 MG/0.4ML ~~LOC~~ SOLN
40.0000 mg | SUBCUTANEOUS | Status: DC
  Administered 2016-04-05 – 2016-04-09 (×5): 40 mg via SUBCUTANEOUS
  Filled 2016-04-04 (×5): qty 0.4

## 2016-04-04 MED ORDER — PHENOL 1.4 % MT LIQD: 1.0000 | OROMUCOSAL | Status: DC | PRN

## 2016-04-04 MED ORDER — SODIUM CHLORIDE 0.9 % IV SOLN
INTRAVENOUS | Status: DC | PRN
  Administered 2016-04-04: 09:00:00

## 2016-04-04 MED ORDER — PROTAMINE SULFATE 10 MG/ML IV SOLN
INTRAVENOUS | Status: AC
  Filled 2016-04-04: qty 5

## 2016-04-04 MED ORDER — MIDAZOLAM HCL 5 MG/5ML IJ SOLN
INTRAMUSCULAR | Status: DC | PRN
  Administered 2016-04-04 (×2): 1 mg via INTRAVENOUS

## 2016-04-04 MED ORDER — EPHEDRINE 5 MG/ML INJ
INTRAVENOUS | Status: AC
  Filled 2016-04-04: qty 10

## 2016-04-04 MED ORDER — HYDROMORPHONE HCL 1 MG/ML IJ SOLN
0.2500 mg | INTRAMUSCULAR | Status: DC | PRN
  Administered 2016-04-04 (×2): 0.5 mg via INTRAVENOUS

## 2016-04-04 MED ORDER — LACTATED RINGERS IV SOLN
INTRAVENOUS | Status: DC | PRN
  Administered 2016-04-04: 07:00:00 via INTRAVENOUS

## 2016-04-04 MED ORDER — LABETALOL HCL 5 MG/ML IV SOLN: 10.0000 mg | INTRAVENOUS | Status: DC | PRN

## 2016-04-04 MED ORDER — HYDRALAZINE HCL 20 MG/ML IJ SOLN: 5.0000 mg | INTRAMUSCULAR | Status: DC | PRN

## 2016-04-04 MED ORDER — FAMOTIDINE IN NACL 20-0.9 MG/50ML-% IV SOLN
20.0000 mg | Freq: Two times a day (BID) | INTRAVENOUS | Status: DC
  Administered 2016-04-04 – 2016-04-08 (×9): 20 mg via INTRAVENOUS
  Filled 2016-04-04 (×10): qty 50

## 2016-04-04 MED ORDER — MAGNESIUM SULFATE 2 GM/50ML IV SOLN
2.0000 g | Freq: Every day | INTRAVENOUS | Status: AC | PRN
  Administered 2016-04-05: 2 g via INTRAVENOUS
  Filled 2016-04-04: qty 50

## 2016-04-04 MED ORDER — VANCOMYCIN HCL IN DEXTROSE 1-5 GM/200ML-% IV SOLN: 1000.0000 mg | Freq: Two times a day (BID) | INTRAVENOUS | Status: DC

## 2016-04-04 MED ORDER — CHLORHEXIDINE GLUCONATE 0.12 % MT SOLN
15.0000 mL | Freq: Two times a day (BID) | OROMUCOSAL | Status: DC
  Administered 2016-04-04 – 2016-04-10 (×11): 15 mL via OROMUCOSAL
  Filled 2016-04-04 (×9): qty 15

## 2016-04-04 MED ORDER — PROMETHAZINE HCL 25 MG/ML IJ SOLN: 6.2500 mg | INTRAMUSCULAR | Status: DC | PRN

## 2016-04-04 MED ORDER — FENTANYL CITRATE (PF) 100 MCG/2ML IJ SOLN
INTRAMUSCULAR | Status: DC | PRN
  Administered 2016-04-04 (×6): 50 ug via INTRAVENOUS

## 2016-04-04 MED ORDER — FENTANYL CITRATE (PF) 100 MCG/2ML IJ SOLN
INTRAMUSCULAR | Status: AC
  Filled 2016-04-04: qty 4

## 2016-04-04 MED ORDER — ALBUMIN HUMAN 5 % IV SOLN
INTRAVENOUS | Status: DC | PRN
  Administered 2016-04-04: 10:00:00 via INTRAVENOUS

## 2016-04-04 MED ORDER — ROCURONIUM BROMIDE 10 MG/ML (PF) SYRINGE
PREFILLED_SYRINGE | INTRAVENOUS | Status: AC
  Filled 2016-04-04: qty 10

## 2016-04-04 MED ORDER — ACETAMINOPHEN 325 MG RE SUPP: 325.0000 mg | RECTAL | Status: DC | PRN

## 2016-04-04 MED ORDER — ALUM & MAG HYDROXIDE-SIMETH 200-200-20 MG/5ML PO SUSP: 15.0000 mL | ORAL | Status: DC | PRN

## 2016-04-04 MED ORDER — HYDROMORPHONE HCL 2 MG/ML IJ SOLN
INTRAMUSCULAR | Status: AC
  Filled 2016-04-04: qty 1

## 2016-04-04 MED ORDER — PROPOFOL 10 MG/ML IV BOLUS
INTRAVENOUS | Status: DC | PRN
  Administered 2016-04-04: 120 mg via INTRAVENOUS

## 2016-04-04 MED ORDER — SODIUM CHLORIDE 0.9 % IV SOLN: 500.0000 mL | Freq: Once | INTRAVENOUS | Status: DC | PRN

## 2016-04-04 MED ORDER — ORAL CARE MOUTH RINSE
15.0000 mL | Freq: Two times a day (BID) | OROMUCOSAL | Status: DC
  Administered 2016-04-05 – 2016-04-09 (×5): 15 mL via OROMUCOSAL

## 2016-04-04 MED ORDER — PHENYLEPHRINE 40 MCG/ML (10ML) SYRINGE FOR IV PUSH (FOR BLOOD PRESSURE SUPPORT)
PREFILLED_SYRINGE | INTRAVENOUS | Status: AC
  Filled 2016-04-04: qty 10

## 2016-04-04 MED ORDER — PHENYLEPHRINE HCL 10 MG/ML IJ SOLN
INTRAMUSCULAR | Status: DC | PRN
  Administered 2016-04-04: 80 ug via INTRAVENOUS
  Administered 2016-04-04: 40 ug via INTRAVENOUS

## 2016-04-04 MED ORDER — GUAIFENESIN-DM 100-10 MG/5ML PO SYRP: 15.0000 mL | ORAL_SOLUTION | ORAL | Status: DC | PRN

## 2016-04-04 MED ORDER — ONDANSETRON HCL 4 MG/2ML IJ SOLN
INTRAMUSCULAR | Status: DC | PRN
  Administered 2016-04-04: 4 mg via INTRAVENOUS

## 2016-04-04 MED ORDER — MEPERIDINE HCL 25 MG/ML IJ SOLN: 6.2500 mg | INTRAMUSCULAR | Status: DC | PRN

## 2016-04-04 MED ORDER — EPHEDRINE SULFATE 50 MG/ML IJ SOLN
INTRAMUSCULAR | Status: DC | PRN
  Administered 2016-04-04: 15 mg via INTRAVENOUS
  Administered 2016-04-04: 5 mg via INTRAVENOUS

## 2016-04-04 MED ORDER — KCL IN DEXTROSE-NACL 20-5-0.45 MEQ/L-%-% IV SOLN
INTRAVENOUS | Status: DC
  Administered 2016-04-04 – 2016-04-05 (×2): via INTRAVENOUS
  Administered 2016-04-05: 1000 mL via INTRAVENOUS
  Administered 2016-04-06 – 2016-04-07 (×5): via INTRAVENOUS
  Filled 2016-04-04 (×12): qty 1000

## 2016-04-04 MED ORDER — GLYCOPYRROLATE 0.2 MG/ML IJ SOLN
INTRAMUSCULAR | Status: DC | PRN
  Administered 2016-04-04: 0.2 mg via INTRAVENOUS

## 2016-04-04 MED ORDER — HEPARIN SODIUM (PORCINE) 1000 UNIT/ML IJ SOLN
INTRAMUSCULAR | Status: AC
  Filled 2016-04-04: qty 1

## 2016-04-04 MED ORDER — HYDROMORPHONE HCL 1 MG/ML IJ SOLN
0.5000 mg | INTRAMUSCULAR | Status: DC | PRN
  Administered 2016-04-04 – 2016-04-05 (×5): 1 mg via INTRAVENOUS
  Filled 2016-04-04 (×5): qty 1

## 2016-04-04 MED ORDER — MIDAZOLAM HCL 2 MG/2ML IJ SOLN
INTRAMUSCULAR | Status: AC
  Filled 2016-04-04: qty 2

## 2016-04-04 MED ORDER — PROTAMINE SULFATE 10 MG/ML IV SOLN
INTRAVENOUS | Status: DC | PRN
  Administered 2016-04-04: 20 mg via INTRAVENOUS
  Administered 2016-04-04: 10 mg via INTRAVENOUS

## 2016-04-04 MED ORDER — HEPARIN SODIUM (PORCINE) 1000 UNIT/ML IJ SOLN
INTRAMUSCULAR | Status: DC | PRN
  Administered 2016-04-04: 6000 [IU] via INTRAVENOUS

## 2016-04-04 MED ORDER — PROPOFOL 10 MG/ML IV BOLUS
INTRAVENOUS | Status: AC
  Filled 2016-04-04: qty 20

## 2016-04-04 MED ORDER — ONDANSETRON HCL 4 MG/2ML IJ SOLN
INTRAMUSCULAR | Status: AC
  Filled 2016-04-04: qty 2

## 2016-04-04 MED ORDER — POTASSIUM CHLORIDE CRYS ER 20 MEQ PO TBCR: 20.0000 meq | EXTENDED_RELEASE_TABLET | Freq: Every day | ORAL | Status: DC | PRN

## 2016-04-04 MED ORDER — GLYCOPYRROLATE 0.2 MG/ML IV SOSY
PREFILLED_SYRINGE | INTRAVENOUS | Status: AC
  Filled 2016-04-04: qty 3

## 2016-04-04 MED ORDER — SUGAMMADEX SODIUM 200 MG/2ML IV SOLN
INTRAVENOUS | Status: DC | PRN
  Administered 2016-04-04: 150 mg via INTRAVENOUS

## 2016-04-04 MED ORDER — ONDANSETRON HCL 4 MG/2ML IJ SOLN
4.0000 mg | Freq: Four times a day (QID) | INTRAMUSCULAR | Status: DC | PRN
  Administered 2016-04-04 – 2016-04-06 (×2): 4 mg via INTRAVENOUS
  Filled 2016-04-04 (×2): qty 2

## 2016-04-04 MED ORDER — 0.9 % SODIUM CHLORIDE (POUR BTL) OPTIME
TOPICAL | Status: DC | PRN
  Administered 2016-04-04: 3000 mL

## 2016-04-04 MED ORDER — LACTATED RINGERS IV SOLN: INTRAVENOUS | Status: DC

## 2016-04-04 MED ORDER — SUGAMMADEX SODIUM 200 MG/2ML IV SOLN
INTRAVENOUS | Status: AC
  Filled 2016-04-04: qty 2

## 2016-04-04 MED ORDER — METOPROLOL TARTRATE 5 MG/5ML IV SOLN: 2.0000 mg | INTRAVENOUS | Status: DC | PRN

## 2016-04-04 MED ORDER — ROCURONIUM BROMIDE 100 MG/10ML IV SOLN
INTRAVENOUS | Status: DC | PRN
  Administered 2016-04-04 (×2): 20 mg via INTRAVENOUS
  Administered 2016-04-04: 50 mg via INTRAVENOUS

## 2016-04-04 MED ORDER — PHENYLEPHRINE HCL 10 MG/ML IJ SOLN
INTRAVENOUS | Status: DC | PRN
  Administered 2016-04-04: 20 ug/min via INTRAVENOUS

## 2016-04-04 MED ORDER — MANNITOL 25 % IV SOLN
INTRAVENOUS | Status: DC | PRN
  Administered 2016-04-04: 25 g via INTRAVENOUS

## 2016-04-04 MED ORDER — LACTATED RINGERS IV SOLN
INTRAVENOUS | Status: DC | PRN
  Administered 2016-04-04 (×2): via INTRAVENOUS

## 2016-04-04 MED ORDER — VANCOMYCIN HCL IN DEXTROSE 1-5 GM/200ML-% IV SOLN
1000.0000 mg | Freq: Once | INTRAVENOUS | Status: AC
  Administered 2016-04-04: 1000 mg via INTRAVENOUS
  Filled 2016-04-04: qty 200

## 2016-04-04 MED ORDER — ACETAMINOPHEN 325 MG PO TABS
325.0000 mg | ORAL_TABLET | ORAL | Status: DC | PRN
  Administered 2016-04-08: 650 mg via ORAL
  Filled 2016-04-04: qty 2

## 2016-04-04 MED ORDER — DOCUSATE SODIUM 100 MG PO CAPS
100.0000 mg | ORAL_CAPSULE | Freq: Every day | ORAL | Status: DC
  Administered 2016-04-07 – 2016-04-09 (×3): 100 mg via ORAL
  Filled 2016-04-04 (×4): qty 1

## 2016-04-04 SURGICAL SUPPLY — 44 items
CANISTER SUCTION 2500CC (MISCELLANEOUS) ×3 IMPLANT
CANNULA VESSEL 3MM 2 BLNT TIP (CANNULA) ×6 IMPLANT
CLIP LIGATING EXTRA MED SLVR (CLIP) ×3 IMPLANT
CLIP LIGATING EXTRA SM BLUE (MISCELLANEOUS) ×3 IMPLANT
COVER TABLE BACK 60X90 (DRAPES) ×3 IMPLANT
DERMABOND ADVANCED (GAUZE/BANDAGES/DRESSINGS) ×6
DERMABOND ADVANCED .7 DNX12 (GAUZE/BANDAGES/DRESSINGS) ×3 IMPLANT
ELECT REM PT RETURN 9FT ADLT (ELECTROSURGICAL) ×3
ELECTRODE REM PT RTRN 9FT ADLT (ELECTROSURGICAL) ×1 IMPLANT
FELT TEFLON 1X6 (MISCELLANEOUS) ×3 IMPLANT
GLOVE BIO SURGEON STRL SZ 6.5 (GLOVE) ×2 IMPLANT
GLOVE BIO SURGEONS STRL SZ 6.5 (GLOVE) ×1
GLOVE BIOGEL PI IND STRL 6.5 (GLOVE) ×4 IMPLANT
GLOVE BIOGEL PI IND STRL 7.0 (GLOVE) ×2 IMPLANT
GLOVE BIOGEL PI INDICATOR 6.5 (GLOVE) ×8
GLOVE BIOGEL PI INDICATOR 7.0 (GLOVE) ×4
GLOVE ECLIPSE 6.5 STRL STRAW (GLOVE) ×9 IMPLANT
GLOVE SS BIOGEL STRL SZ 7.5 (GLOVE) ×1 IMPLANT
GLOVE SUPERSENSE BIOGEL SZ 7.5 (GLOVE) ×2
GOWN STRL REUS W/ TWL LRG LVL3 (GOWN DISPOSABLE) ×5 IMPLANT
GOWN STRL REUS W/TWL LRG LVL3 (GOWN DISPOSABLE) ×10
GRAFT HEMASHIELD 14X8MM (Vascular Products) ×3 IMPLANT
INSERT FOGARTY 61MM (MISCELLANEOUS) ×3 IMPLANT
INSERT FOGARTY SM (MISCELLANEOUS) ×6 IMPLANT
KIT BASIN OR (CUSTOM PROCEDURE TRAY) ×3 IMPLANT
KIT ROOM TURNOVER OR (KITS) ×3 IMPLANT
NS IRRIG 1000ML POUR BTL (IV SOLUTION) ×6 IMPLANT
PACK AORTA (CUSTOM PROCEDURE TRAY) ×3 IMPLANT
PAD ARMBOARD 7.5X6 YLW CONV (MISCELLANEOUS) ×6 IMPLANT
STAPLER VISISTAT 35W (STAPLE) IMPLANT
SUT PDS AB 1 TP1 54 (SUTURE) ×6 IMPLANT
SUT PROLENE 3 0 SH1 36 (SUTURE) ×9 IMPLANT
SUT PROLENE 5 0 C 1 24 (SUTURE) ×9 IMPLANT
SUT SILK 2 0 (SUTURE) ×3
SUT SILK 2 0 SH CR/8 (SUTURE) ×3 IMPLANT
SUT SILK 2-0 18XBRD TIE 12 (SUTURE) ×1 IMPLANT
SUT SILK 3 0 (SUTURE) ×3
SUT SILK 3-0 18XBRD TIE 12 (SUTURE) ×1 IMPLANT
SUT VIC AB 2-0 CT1 36 (SUTURE) ×9 IMPLANT
SUT VIC AB 3-0 SH 27 (SUTURE) ×8
SUT VIC AB 3-0 SH 27X BRD (SUTURE) ×4 IMPLANT
TOWEL BLUE STERILE X RAY DET (MISCELLANEOUS) ×6 IMPLANT
TRAY FOLEY W/METER SILVER 16FR (SET/KITS/TRAYS/PACK) ×3 IMPLANT
WATER STERILE IRR 1000ML POUR (IV SOLUTION) ×6 IMPLANT

## 2016-04-04 NOTE — Anesthesia Procedure Notes (Signed)
Procedure Name: Intubation Date/Time: 04/04/2016 7:46 AM Performed by: Jed LimerickHARDER, Parisa Pinela S Pre-anesthesia Checklist: Patient identified, Emergency Drugs available, Suction available and Patient being monitored Patient Re-evaluated:Patient Re-evaluated prior to inductionOxygen Delivery Method: Circle System Utilized Preoxygenation: Pre-oxygenation with 100% oxygen Intubation Type: IV induction Ventilation: Mask ventilation without difficulty Laryngoscope Size: Mac and 3 Grade View: Grade II Tube type: Oral Tube size: 7.0 mm Number of attempts: 1 Airway Equipment and Method: Stylet and Oral airway Placement Confirmation: ETT inserted through vocal cords under direct vision,  positive ETCO2 and breath sounds checked- equal and bilateral Secured at: 22 cm Tube secured with: Tape Dental Injury: Teeth and Oropharynx as per pre-operative assessment

## 2016-04-04 NOTE — Progress Notes (Signed)
  Vascular and Vein Specialists Day of Surgery Note  Subjective:  Patient seen in PACU. Pain with abdominal incision  Vitals:   04/04/16 1415 04/04/16 1430  BP:    Pulse: 89 87  Resp: 14 13  Temp:      Midline abdominal with mild serosanguinous drainage on dressing. Bilateral groin incisions intact without hematoma. Audible doppler flow DP and PT bilaterally.   Assessment/Plan:  This is a 70 y.o. female who is s/p aortobifemoral bypass  Stable post-op. Good UOP.  To 2S when bed available.    Maris BergerKimberly Rosalie Gelpi, [place]PA-C Pager: [phone] 04/04/2016 2:38 PM

## 2016-04-04 NOTE — H&P (Signed)
Office Visit   03/01/2016 [facility]  Nneoma Harral F Meliton Samad, MD  Vascular Surgery   Atherosclerosis of native artery of both lower extremities with intermittent claudication [facility](HCC)  Dx   Re-evaluation ; Referred by Doreen Salvageonald Bulla, PA-C  Reason for Visit   Additional Documentation   Vitals:   BP 113/67 (BP Location: Left Arm, Patient Position: Sitting, Cuff Size: Normal)   Pulse 77   Temp 97.1 F (36.2 C) (Oral)   Resp 16   Ht 5\' 2"  (1.575 m)   Wt 115 lb 14.4 oz (52.6 kg)   SpO2 100%   BMI 21.20 kg/m   BSA 1.52 m      More Vitals   Flowsheets:   Infectious Disease Screening,   Custom Formula Data,   Anthropometrics,   Vital Signs,   MEWS Score     Encounter Info:   Billing Info,   History,   Allergies,   Detailed Report     All Notes   Progress Notes by Larina Earthlyodd F Gladies Sofranko, MD at 03/01/2016 8:45 AM   Author: Larina Earthlyodd F Detrich Rakestraw, MD Author Type: Physician Filed: 03/01/2016 11:17 AM  Note Status: Signed Cosign: Cosign Not Required Encounter Date: 03/01/2016 8:45 AM  Editor: Larina Earthlyodd F Azariah Latendresse, MD (Physician)                                          [facility]  Patient name: Sylvia Rodriguez          MRN: [id number]        DOB: 11/10/1945          Sex: female  REASON FOR VISIT: Today for discussion of her lower extremity arterial insufficiency.   HPI: Sylvia Mantislizabeth Bennette is a 70 y.o. female history like claudication and underwent outpatient arteriogram on 02/01/2016. 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 She reports that she is severely limited by this. She is extremely active and is not able to tolerate this level, claudication. Expanded she does not have any good endovascular treatment options for this. She did see Dr. Duke Salviaandolph for cardiac clearance and was felt to be an acceptable risk for aortic  surgery.      Past Medical History:  Diagnosis Date  . Essential hypertension 02/19/2016  . Hyperlipidemia 02/19/2016  . Peripheral arterial disease (HCC) 02/19/2016         Family History  Problem Relation Age of Onset  . Diabetes Mother   . Heart attack Mother   . Valvular heart disease Mother   . Stroke Maternal Aunt   . Heart attack Maternal Uncle     SOCIAL HISTORY:      Social History  Substance Use Topics  . Smoking status: Former Smoker    Quit date: 01/19/2011  . Smokeless tobacco: Never Used  . Alcohol use No         Allergies  Allergen Reactions  . Aspirin Other (See Comments)    Pt sick on stomach   . Codeine Other (See Comments)    Bone Pain  . Penicillins Hives    Has patient had a PCN reaction causing immediate rash, facial/tongue/throat swelling, SOB or lightheadedness with hypotension: Yes Has patient had a PCN reaction causing severe rash involving mucus membranes or skin  necrosis: No Has patient had a PCN reaction that required hospitalization No Has patient had a PCN reaction occurring within the last 10 years: No If all of the above answers are "NO", then may proceed with Cephalosporin use.           Current Outpatient Prescriptions  Medication Sig Dispense Refill  . atorvastatin (LIPITOR) 10 MG tablet Take 10 mg by mouth daily.    . cilostazol (PLETAL) 100 MG tablet Take 100 mg by mouth 2 (two) times daily.    . clopidogrel (PLAVIX) 75 MG tablet Take 75 mg by mouth daily.    . diazepam (VALIUM) 2 MG tablet Take 2 mg by mouth every 6 (six) hours as needed for muscle spasms.     . diclofenac sodium (VOLTAREN) 1 % GEL Apply 2 g topically daily as needed (shoulder pain).     . Multiple Vitamins-Minerals (TH COMPLETE MULTI PO) Take 30 mLs by mouth daily as needed.    Marland Kitchen. omeprazole (PRILOSEC) 40 MG capsule Take 40 mg by mouth daily.    . pentoxifylline (TRENTAL) 400 MG CR tablet Take 400 mg by mouth daily.     .  traMADol (ULTRAM) 50 MG tablet Take 50 mg by mouth every 6 (six) hours as needed for moderate pain.      No current facility-administered medications for this visit.     REVIEW OF SYSTEMS:  [X]  denotes positive finding, [ ]  denotes negative finding Cardiac  Comments:  Chest pain or chest pressure:    Shortness of breath upon exertion:    Short of breath when lying flat:    Irregular heart rhythm:        Vascular    Pain in calf, thigh, or hip brought on by ambulation: x   Pain in feet at night that wakes you up from your sleep:     Blood clot in your veins:    Leg swelling:           PHYSICAL EXAM:    Vitals:   03/01/16 0826  BP: 113/67  Pulse: 77  Resp: 16  Temp: 97.1 F (36.2 C)  TempSrc: Oral  SpO2: 100%  Weight: 115 lb 14.4 oz (52.6 kg)  Height: 5\' 2"  (1.575 m)    GENERAL: The patient is a well-nourished female, in no acute distress. The vital signs are documented above. CARDIOVASCULAR: Absent pedal pulses bilaterally PULMONARY: There is good air exchange  MUSCULOSKELETAL: There are no major deformities or cyanosis. NEUROLOGIC: No focal weakness or paresthesias are detected. SKIN: There are no ulcers or rashes noted. PSYCHIATRIC: The patient has a normal affect.  DATA:  Reviewed her care gram from 01/27/2016  MEDICAL ISSUES: Had long discussion with patient regarding the magnitude of aortobifemoral bypass. Explained the expected 1-2 days of intensive care and proximally 5-7 day hospitalization. Splane the expected the prolonged recovery time to get back to her baseline. I did explain that this was not limb threatening. She is on able to tolerate this degree of claudication wish to proceed with surgery. Her husband is a Naval architecttruck driver and we'll coordinate this around time which would be available for him to help care for her post discharge.    Larina Earthlyodd F. Taneesha Edgin, MD FACS [facility] Office Tel  [phone] Pager [phone]     Instructions    After Visit Summary (Printed 03/01/2016)  Communications      [facility] CC Chart Rep sent to Doreen Salvageonald Bulla, PA-C  Media   Electronic signature on 03/01/2016 7:47 AM   Orders Placed    None  Medication Changes     None    Medication List  Visit Diagnoses      Atherosclerosis of native artery of both lower extremities with intermittent claudication (HCC)    Problem List  Level of Service   Level of Service  PR OFFICE OUTPATIENT VISIT 25 MINUTES [99214]  All Charges for This Encounter   Code Description Service Date Service Provider Modifiers Qty  99214 PR OFFICE OUTPATIENT VISIT 25 MINUTES 03/01/2016 Larina Earthly, MD  1  [phone] PR ASA/ANTIPLAT THER USED 03/01/2016 Larina Earthly, MD  1  1036F PR CURRENT TOBACCO NON-USER 03/01/2016 Larina Earthly, MD  1   Addendum:  The patient has been re-examined and re-evaluated.  The patient's history and physical has been reviewed and is unchanged.    GRASIELA JONSSON is a 70 y.o. female is being admitted with Peripheral vascular disease with bilateral lower extremity claudication I70.213. All the risks, benefits and other treatment options have been discussed with the patient. The patient has consented to proceed with Procedure(s): AORTOBIFEMORAL BYPASS GRAFT as a surgical intervention.  Gretta Began 04/04/2016 7:02 AM Vascular and Vein Surgery

## 2016-04-04 NOTE — Anesthesia Preprocedure Evaluation (Addendum)
Anesthesia Evaluation  Patient identified by MRN, date of birth, ID band Patient awake    Reviewed: Allergy & Precautions, NPO status , Patient's Chart, lab work & pertinent test results  Airway Mallampati: II  TM Distance: >3 FB Neck ROM: Full    Dental  (+) Teeth Intact, Dental Advisory Given   Pulmonary former smoker,    breath sounds clear to auscultation       Cardiovascular hypertension, Pt. on medications + Peripheral Vascular Disease   Rhythm:Regular Rate:Normal + Systolic murmurs    Neuro/Psych negative neurological ROS  negative psych ROS   GI/Hepatic Neg liver ROS, GERD  Medicated,  Endo/Other  negative endocrine ROS  Renal/GU negative Renal ROS  negative genitourinary   Musculoskeletal  (+) Arthritis , Osteoarthritis,    Abdominal   Peds negative pediatric ROS (+)  Hematology negative hematology ROS (+)   Anesthesia Other Findings   Reproductive/Obstetrics negative OB ROS                            Lab Results  Component Value Date   WBC 5.3 03/25/2016   HGB 12.1 03/25/2016   HCT 36.9 03/25/2016   MCV 87.2 03/25/2016   PLT 240 03/25/2016   Lab Results  Component Value Date   CREATININE 0.82 03/25/2016   BUN 14 03/25/2016   NA 140 03/25/2016   K 4.0 03/25/2016   CL 105 03/25/2016   CO2 25 03/25/2016   Lab Results  Component Value Date   INR 1.03 03/25/2016   01/2016 EKG: normal sinus rhythm.   Anesthesia Physical Anesthesia Plan  ASA: III  Anesthesia Plan: General   Post-op Pain Management:    Induction: Intravenous  Airway Management Planned: Oral ETT  Additional Equipment: CVP, Ultrasound Guidance Line Placement and Arterial line  Intra-op Plan:   Post-operative Plan: Possible Post-op intubation/ventilation  Informed Consent: I have reviewed the patients History and Physical, chart, labs and discussed the procedure including the risks, benefits  and alternatives for the proposed anesthesia with the patient or authorized representative who has indicated his/her understanding and acceptance.   Dental advisory given  Plan Discussed with: CRNA  Anesthesia Plan Comments:        Anesthesia Quick Evaluation

## 2016-04-04 NOTE — Progress Notes (Signed)
Pharmacy Antibiotic Note  Sylvia Rodriguez is a 70 y.o. female admitted on 04/04/2016 with bilateral lower extremity claudication, s/p aortobifemoral bypass grafting.  Pharmacy has been consulted for Vancomycin dosing for post-op prophylaxis x 24 hours.  Pre-op Vancomycin dose given 11/20 at 0755.  Plan: Vancomycin 1gm IV x 1 dose will provide sufficient post-op coverage.  Height: 5\' 1"  (154.9 cm) Weight: 116 lb 2 oz (52.7 kg) IBW/kg (Calculated) : 47.8  Temp (24hrs), Avg:97.6 F (36.4 C), Min:97.3 F (36.3 C), Max:97.9 F (36.6 C)   Recent Labs Lab 04/04/16 1200  WBC 8.8  CREATININE 0.75    Estimated Creatinine Clearance: 49.4 mL/min (by C-G formula based on SCr of 0.75 mg/dL).    Allergies  Allergen Reactions  . Penicillins Hives     Has patient had a PCN reaction causing immediate rash, facial/tongue/throat swelling, SOB or lightheadedness with hypotension:  # # YES # #  Has patient had a PCN reaction causing severe rash involving mucus membranes or skin necrosis: No Has patient had a PCN reaction that required hospitalization No Has patient had a PCN reaction occurring within the last 10 years: No If all of the above answers are "NO", then may proceed with Cephalosporin use.    . Aspirin Nausea And Vomiting  . Codeine Other (See Comments)    Bone Pain    Antimicrobials this admission: Pre-op Vancomycin dose given 11/20 at 0755.  Dose adjustments this admission:  Microbiology results:   Thank you for allowing pharmacy to be a part of this patient's care.  Toys 'R' UsKimberly Kyanne Rials, Pharm.D., BCPS Clinical Pharmacist Pager [phone] 04/04/2016 5:38 PM

## 2016-04-04 NOTE — Anesthesia Procedure Notes (Signed)
Central Venous Catheter Insertion Performed by: anesthesiologist 04/04/2016 7:00 AM Patient location: Pre-op. Preanesthetic checklist: patient identified, IV checked, site marked, risks and benefits discussed, surgical consent, monitors and equipment checked, pre-op evaluation, timeout performed and anesthesia consent Position: Trendelenburg Lidocaine 1% used for infiltration Landmarks identified and Seldinger technique used Catheter size: 8.5 Fr Central line was placed.Sheath introducer Swan type and PA catheter depth:thermodilution and 50PA Cath depth:50 Procedure performed using ultrasound guided technique. Attempts: 1 Following insertion, line sutured and dressing applied. Post procedure assessment: blood return through all ports, free fluid flow and no air. Patient tolerated the procedure well with no immediate complications.

## 2016-04-04 NOTE — Transfer of Care (Signed)
Immediate Anesthesia Transfer of Care Note  Patient: Sylvia ConesElizabeth A Hardwick  Procedure(s) Performed: Procedure(s): AORTOBIFEMORAL BYPASS GRAFT (Bilateral)  Patient Location: PACU  Anesthesia Type:General  Level of Consciousness: awake, alert  and oriented  Airway & Oxygen Therapy: Patient Spontanous Breathing and Patient connected to nasal cannula oxygen  Post-op Assessment: Report given to RN and Post -op Vital signs reviewed and stable  Post vital signs: Reviewed and stable  Last Vitals:  Vitals:   04/04/16 0643  BP: (!) 118/34  Pulse: 73  Resp: 18  Temp: 36.4 C    Last Pain:  Vitals:   04/04/16 0643  TempSrc: Oral         Complications: No apparent anesthesia complications

## 2016-04-04 NOTE — Anesthesia Postprocedure Evaluation (Signed)
Anesthesia Post Note  Patient: Sylvia Rodriguez  Procedure(s) Performed: Procedure(s) (LRB): AORTOBIFEMORAL BYPASS GRAFT (Bilateral)  Patient location during evaluation: PACU Anesthesia Type: General Level of consciousness: awake and alert Pain management: pain level controlled Vital Signs Assessment: post-procedure vital signs reviewed and stable Respiratory status: spontaneous breathing, nonlabored ventilation, respiratory function stable and patient connected to nasal cannula oxygen Cardiovascular status: blood pressure returned to baseline and stable Postop Assessment: no signs of nausea or vomiting Anesthetic complications: no    Last Vitals:  Vitals:   04/04/16 1721 04/04/16 1730  BP: (!) 132/48 (!) 135/58  Pulse: 64 60  Resp: 11 11  Temp:      Last Pain:  Vitals:   04/04/16 1702  TempSrc:   PainSc: Asleep                 Shelton SilvasKevin D Hollis

## 2016-04-04 NOTE — Op Note (Signed)
OPERATIVE REPORT  DATE OF SURGERY: 04/04/2016  PATIENT: Sylvia Rodriguez, 70 y.o. female MRN: [id number]  DOB: 10/17/1945  PRE-OPERATIVE DIAGNOSIS: Aortoiliac occlusive disease  POST-OPERATIVE DIAGNOSIS:  Same  PROCEDURE: Aortobifemoral bypass with a 14 x 8 Hemashield graft  SURGEON:  Gretta Beganodd Vansh Reckart, M.D.  PHYSICIAN ASSISTANT: Karsten RoKim Trinh PA-C  ANESTHESIA:  Gen.  EBL: 300 ml  Total I/O In: 2450 [I.V.:2200; IV Piggyback:250] Out: 1225 [Urine:925; Blood:300]  BLOOD ADMINISTERED: None  DRAINS: None  SPECIMEN: None  COUNTS CORRECT:  YES  PLAN OF CARE: PACU extubated   PATIENT DISPOSITION:  PACU - hemodynamically stable  PROCEDURE DETAILS: Patient was taken to the operative placed supine position where the area of the abdomen both groins using sterile fashion. Incision was made from the level of xiphoid to below the umbilicus and carried down through the midline fascia with electrocautery. The peritoneum was entered. There was some adhesion of the omentum in the right lower quadrant this was removed to give exposure. The transverse colon omentum reflected to the superiorly and the small bowel was reflected to the right. The duodenum was mobilized off the aorta and the aorta was encircled just below the level of the left renal vein. There were several curvature branches off the anterior surface of the aorta and these were ligated and divided. Dissection was continued down to the level of the aortic bifurcation. The patient had a large infrarenal mesenteric artery and this was preserved. Separate incisions were made over the common femoral artery bilaterally and the common femoral artery was exposed bilaterally at the level of the inguinal ligament. This was mobilized down to the level of the 2 professional femoral artery and profunda bifurcation. Tunnels were created from the groin up to the level of the bifurcation take Concerta passed behind the level of the ureter bilaterally.  Umbilical tape was passed through these tunnels for later passage of the limbs of the graft. The patient was given 25 g of mannitol and excelled in intravenous heparin. After adequate circulation time the aorta was occluded below the level of the renal arteries. The aorta was occluded distally above the level of the IMA. The aorta was transected below the level of proximal clamp and a segment of aorta was removed removed for positioning of the graft. Distal aorta was oversewn with 2 layers of 3-0 Prolene suture. This anastomosis tested and found to be adequate. A 14 x 8 Hemashield graft was brought onto the field. A felt cuff was used for reinforcement. The aortic graft was cut with a small aortic body. This was sewn into into the aorta with a running 3-0 Prolene suture. The anastomosis tested and found be adequate. The right and left limbs were brought down to the respective groins to the prior created tunnels. The common femoral arteries were occluded proximally and distally was opened 11 blade; ulcerative Potts scissors. The graft was spatulated and sewn end-to-side to the common femoral artery bilaterally with running 5-0 Prolene sutures. Prior to completion of closure the usual flushing maneuvers were undertaken. The anastomoses were completed and flow was restored. Patient had excellent Doppler flow through the superficial femoral arteries bilaterally. Patient was given 50 mg of protamine to reverse the heparin. The wounds were irrigated with saline. Hemostasis tablet cautery. The groin incisions were closed with 2-0 Vicryl in the fascia and the skin was closed with 3 or septic or Vicryl stitch. Abdominal wound was closed with 2-0 Vicryl to reapproximate the retroperitoneum over the graft to  exclude the duodenum from the graft. The small bowel was run its entirety and found be related to the injury was placed back in the pelvis. The transverse colon omentum replaced over this. The midline fascia was closed  with #1 PDS suture beginning proximally and distally and tying in the middle. The abdominal skin was closed with 3-0 subcuticular Vicryl suture. Dermabond was placed on all incisions and the patient was extubated and transferred to the recovery room in stable condition   Larina Earthlyodd F. Hudsyn Barich, M.D., [facility] 04/04/2016 12:46 PM

## 2016-04-05 ENCOUNTER — Encounter (HOSPITAL_COMMUNITY): Payer: Self-pay | Admitting: Vascular Surgery

## 2016-04-05 ENCOUNTER — Inpatient Hospital Stay (HOSPITAL_COMMUNITY): Payer: BLUE CROSS/BLUE SHIELD

## 2016-04-05 DIAGNOSIS — I7409 Other arterial embolism and thrombosis of abdominal aorta: Secondary | ICD-10-CM

## 2016-04-05 LAB — COMPREHENSIVE METABOLIC PANEL WITH GFR
ALT: 15 U/L (ref 14–54)
AST: 25 U/L (ref 15–41)
Albumin: 3.1 g/dL — ABNORMAL LOW (ref 3.5–5.0)
Alkaline Phosphatase: 43 U/L (ref 38–126)
Anion gap: 7 (ref 5–15)
BUN: 9 mg/dL (ref 6–20)
CO2: 27 mmol/L (ref 22–32)
Calcium: 8.1 mg/dL — ABNORMAL LOW (ref 8.9–10.3)
Chloride: 100 mmol/L — ABNORMAL LOW (ref 101–111)
Creatinine, Ser: 0.73 mg/dL (ref 0.44–1.00)
GFR calc Af Amer: >60 mL/min
GFR calc non Af Amer: >60 mL/min
Glucose, Bld: 232 mg/dL — ABNORMAL HIGH (ref 65–99)
Potassium: 4.3 mmol/L (ref 3.5–5.1)
Sodium: 134 mmol/L — ABNORMAL LOW (ref 135–145)
Total Bilirubin: 0.6 mg/dL (ref 0.3–1.2)
Total Protein: 5.5 g/dL — ABNORMAL LOW (ref 6.5–8.1)

## 2016-04-05 LAB — CBC
HCT: 34 % — ABNORMAL LOW (ref 36.0–46.0)
Hemoglobin: 11.4 g/dL — ABNORMAL LOW (ref 12.0–15.0)
MCH: 29.2 pg (ref 26.0–34.0)
MCHC: 33.5 g/dL (ref 30.0–36.0)
MCV: 87 fL (ref 78.0–100.0)
PLATELETS: 151 10*3/uL (ref 150–400)
RBC: 3.91 MIL/uL (ref 3.87–5.11)
RDW: 14.7 % (ref 11.5–15.5)
WBC: 7.7 10*3/uL (ref 4.0–10.5)

## 2016-04-05 LAB — COMPREHENSIVE METABOLIC PANEL
ALBUMIN: 3.1 g/dL — AB (ref 3.5–5.0)
ALK PHOS: 47 U/L (ref 38–126)
ALT: 14 U/L (ref 14–54)
ANION GAP: 6 (ref 5–15)
AST: 22 U/L (ref 15–41)
BUN: 9 mg/dL (ref 6–20)
CALCIUM: 8.1 mg/dL — AB (ref 8.9–10.3)
CO2: 27 mmol/L (ref 22–32)
Chloride: 102 mmol/L (ref 101–111)
Creatinine, Ser: 0.77 mg/dL (ref 0.44–1.00)
GFR calc Af Amer: >60 mL/min (ref >60)
GFR calc non Af Amer: >60 mL/min (ref >60)
GLUCOSE: 172 mg/dL — AB (ref 65–99)
Potassium: 4.7 mmol/L (ref 3.5–5.1)
SODIUM: 135 mmol/L (ref 135–145)
Total Bilirubin: 0.6 mg/dL (ref 0.3–1.2)
Total Protein: 5.3 g/dL — ABNORMAL LOW (ref 6.5–8.1)

## 2016-04-05 LAB — POCT I-STAT 3, ART BLOOD GAS (G3+)
ACID-BASE DEFICIT: 11 mmol/L — AB (ref 0.0–2.0)
ACID-BASE EXCESS: 9 mmol/L — AB (ref 0.0–2.0)
BICARBONATE: 36.2 mmol/L — AB (ref 20.0–28.0)
Bicarbonate: 18.7 mmol/L — ABNORMAL LOW (ref 20.0–28.0)
O2 Saturation: 80 %
O2 Saturation: 98 %
PCO2 ART: 58.3 mmHg — AB (ref 32.0–48.0)
PH ART: 7.115 — AB (ref 7.350–7.450)
PO2 ART: 60 mmHg — AB (ref 83.0–108.0)
PO2 ART: 110 mmHg — AB (ref 83.0–108.0)
Patient temperature: 98.8
Patient temperature: 99.5
TCO2: 20 mmol/L (ref 0–100)
TCO2: 38 mmol/L (ref 0–100)
pCO2 arterial: 65.4 mmHg (ref 32.0–48.0)
pH, Arterial: 7.353 (ref 7.350–7.450)

## 2016-04-05 LAB — BLOOD GAS, ARTERIAL
Acid-Base Excess: 2.6 mmol/L — ABNORMAL HIGH (ref 0.0–2.0)
Bicarbonate: 28.6 mmol/L — ABNORMAL HIGH (ref 20.0–28.0)
Drawn by: 345601
O2 Content: 2 L/min
O2 Saturation: 94.1 %
Patient temperature: 98.6
pCO2 arterial: 61.6 mmHg — ABNORMAL HIGH (ref 32.0–48.0)
pH, Arterial: 7.288 — ABNORMAL LOW (ref 7.350–7.450)
pO2, Arterial: 81.5 mmHg — ABNORMAL LOW (ref 83.0–108.0)

## 2016-04-05 LAB — AMYLASE: Amylase: 89 U/L (ref 28–100)

## 2016-04-05 LAB — MAGNESIUM: MAGNESIUM: 1.4 mg/dL — AB (ref 1.7–2.4)

## 2016-04-05 LAB — PROCALCITONIN: Procalcitonin: 0.25 ng/mL

## 2016-04-05 LAB — LACTIC ACID, PLASMA: Lactic Acid, Venous: 1.3 mmol/L (ref 0.5–1.9)

## 2016-04-05 MED ORDER — HYDROMORPHONE HCL 1 MG/ML IJ SOLN
0.5000 mg | INTRAMUSCULAR | Status: DC | PRN
  Administered 2016-03-29: 1 mg via INTRAVENOUS
  Administered 2016-04-05 – 2016-04-06 (×3): 0.5 mg via INTRAVENOUS
  Filled 2016-04-05 (×3): qty 1

## 2016-04-05 MED FILL — Sodium Chloride IV Soln 0.9%: INTRAVENOUS | Qty: 1000 | Status: AC

## 2016-04-05 MED FILL — Heparin Sodium (Porcine) Inj 1000 Unit/ML: INTRAMUSCULAR | Qty: 30 | Status: AC

## 2016-04-05 NOTE — Progress Notes (Signed)
Patient requested to go back on her NIV. On arrival, pt seems restless and fatigued. Pt stated that she feels SOB. Pt is stable at this time. SATs are stable.  Pt cough is very weak due to abdominal pain. No distress noted.

## 2016-04-05 NOTE — Progress Notes (Signed)
OT Cancellation Note  Patient Details Name: Sylvia Rodriguez MRN: [id number] DOB: Jun 29, 1945   Cancelled Treatment:    Reason Eval/Treat Not Completed: Medical issues which prohibited therapy.  Per RN, pt going to be placed on Bipap.  Will defer OT at this time, and reattempt tomorrow.   Bricyn Labrada [place], OTR/L [phone]   Jeani HawkingConarpe, Jamarrius Salay M 04/05/2016, 12:05 PM

## 2016-04-05 NOTE — Progress Notes (Signed)
Inpatient Diabetes Program Recommendations  AACE/ADA: New Consensus Statement on Inpatient Glycemic Control (2015)  Target Ranges:  Prepandial:   less than 140 mg/dL      Peak postprandial:   less than 180 mg/dL (1-2 hours)      Critically ill patients:  140 - 180 mg/dL  Results for Sylvia ConesFOSTER, [name] (MRN [id number]) as of 04/05/2016 11:26  Ref. Range 04/04/2016 12:00 04/05/2016 02:25  Glucose Latest Ref Range: 65 - 99 mg/dL 952219 (H) 841232 (H)    Review of Glycemic Control  Diabetes history: No Outpatient Diabetes medications: NA Current orders for Inpatient glycemic control: None  Inpatient Diabetes Program Recommendations: Correction (SSI): Noted lab glucose this morning 232 mg/dl. Please consider ordering CBGs with Novolog correction scale Q4H. HgbA1C: Please add on an A1C to blood in lab to evaluate glycemic control over the past 2-3 months.  Thanks, Orlando PennerMarie Brelan Hannen, RN, MSN, CDE Diabetes Coordinator Inpatient Diabetes Program [phone] (Team Pager from 8am to 5pm)

## 2016-04-05 NOTE — Consult Note (Signed)
PULMONARY / CRITICAL CARE MEDICINE   Name: Sylvia Rodriguez MRN: [id number] DOB: June 27, 1945    ADMISSION DATE:  04/04/2016 CONSULTATION DATE:  04/05/16  REFERRING MD:  Curt Jews MD  CHIEF COMPLAINT:  HYpercarbia, acidosis  HISTORY OF PRESENT ILLNESS:   70 year old with past medical history of arthritis, hypertension, peripheral artery disease. She was admitted yesterday for aortobifemoral bypass. ABGs today a.m. show worsening acidosis with hypercarbia. PCCM consulted for further evaluation.  PAST MEDICAL HISTORY :  She  has a past medical history of Arthritis; Complication of anesthesia; Essential hypertension (02/19/2016); History of pneumonia; Hyperlipidemia (02/19/2016); and Peripheral arterial disease ([place]) (02/19/2016).  PAST SURGICAL HISTORY: She  has a past surgical history that includes Cardiac catheterization (N/A, 01/27/2016); Colonoscopy; Cataract extraction w/ intraocular lens implant (Bilateral); Ankle surgery; Appendectomy; Tonsillectomy; Cyst removal hand; and Oophorectomy.  Allergies  Allergen Reactions  . Penicillins Hives     Has patient had a PCN reaction causing immediate rash, facial/tongue/throat swelling, SOB or lightheadedness with hypotension:  # # YES # #  Has patient had a PCN reaction causing severe rash involving mucus membranes or skin necrosis: No Has patient had a PCN reaction that required hospitalization No Has patient had a PCN reaction occurring within the last 10 years: No If all of the above answers are "NO", then may proceed with Cephalosporin use.    . Aspirin Nausea And Vomiting  . Codeine Other (See Comments)    Bone Pain    No current facility-administered medications on file prior to encounter.    Current Outpatient Prescriptions on File Prior to Encounter  Medication Sig  . atorvastatin (LIPITOR) 10 MG tablet Take 10 mg by mouth at bedtime.   . cilostazol (PLETAL) 100 MG tablet Take 100 mg by mouth 2 (two) times daily.  .  clopidogrel (PLAVIX) 75 MG tablet Take 75 mg by mouth daily.  . diazepam (VALIUM) 2 MG tablet Take 2 mg by mouth 4 (four) times daily as needed (for spasms/cramping.).   Marland Kitchen diclofenac sodium (VOLTAREN) 1 % GEL Apply 2 g topically daily as needed (shoulder pain).   . Multiple Vitamins-Minerals (TH COMPLETE MULTI PO) Take 30 mLs by mouth daily.   Marland Kitchen omeprazole (PRILOSEC) 40 MG capsule Take 40 mg by mouth daily.  . pentoxifylline (TRENTAL) 400 MG CR tablet Take 400 mg by mouth daily.   . traMADol (ULTRAM) 50 MG tablet Take 50 mg by mouth every 6 (six) hours as needed (for pain.).     FAMILY HISTORY:  Her indicated that her mother is deceased. She indicated that her father is deceased. She indicated that the status of her maternal aunt is unknown. She indicated that her maternal uncle is deceased.    SOCIAL HISTORY: She  reports that she quit smoking about 5 years ago. She has never used smokeless tobacco. She reports that she does not drink alcohol or use drugs.  REVIEW OF SYSTEMS:   Complains of abdominal pain, nausea. Denies any chest pain, palpitation, dyspnea Has white mucus production. No fevers, chills.  SUBJECTIVE:    VITAL SIGNS: BP (!) 130/57   Pulse (!) 126   Temp 98.9 F (37.2 C) (Oral)   Resp (!) 21   Ht '5\' 1"'$  (1.549 m)   Wt 116 lb 2 oz (52.7 kg)   SpO2 94%   BMI 21.94 kg/m   HEMODYNAMICS:    VENTILATOR SETTINGS:    INTAKE / OUTPUT: I/O last 3 completed shifts: In: 4327.9 [I.V.:3907.9; NG/GT:120; IV Piggyback:300] Out:  2350 [Urine:1700; Emesis/NG output:350; Blood:300]  PHYSICAL EXAMINATION: General:  Awake, alert, oriented, mild distress Neuro:  No focal deficits HEENT:  PERRLA, no thyromegaly, JVD Cardiovascular: Regular rate and rhythm, no MRG  Lungs:  No wheeze, b/l crackles Abdomen: Soft, + BS Musculoskeletal:  Normal tone and bulk, no edema Skin:  Intact  LABS:  BMET  Recent Labs Lab 04/04/16 1200 04/05/16 0225  NA 137 134*  K 3.4* 4.3   CL 104 100*  CO2 26 27  BUN 13 9  CREATININE 0.75 0.73  GLUCOSE 219* 232*    Electrolytes  Recent Labs Lab 04/04/16 1200 04/05/16 0225  CALCIUM 8.3* 8.1*  MG 1.3* 1.4*    CBC  Recent Labs Lab 04/04/16 1200 04/05/16 0225  WBC 8.8 7.7  HGB 10.8* 11.4*  HCT 31.9* 34.0*  PLT 142* 151    Coag's  Recent Labs Lab 04/04/16 1240  APTT 28  INR 1.25    Sepsis Markers No results for input(s): LATICACIDVEN, PROCALCITON, O2SATVEN in the last 168 hours.  ABG  Recent Labs Lab 04/04/16 1230 04/05/16 0340 04/05/16 1117  PHART 7.364 7.288* 7.115*  PCO2ART 50.1* 61.6* 58.3*  PO2ART 105 81.5* 60.0*    Liver Enzymes  Recent Labs Lab 04/05/16 0225  AST 25  ALT 15  ALKPHOS 43  BILITOT 0.6  ALBUMIN 3.1*    Cardiac Enzymes No results for input(s): TROPONINI, PROBNP in the last 168 hours.  Glucose No results for input(s): GLUCAP in the last 168 hours.  Imaging Dg Chest Port 1 View  Result Date: 04/05/2016 CLINICAL DATA:  Status post aortobifemoral bypass. Shortness of breath. EXAM: PORTABLE CHEST 1 VIEW COMPARISON:  04/04/2016 FINDINGS: Right jugular sheath terminates over the upper SVC, unchanged. Enteric tube terminates over the stomach. The cardiomediastinal silhouette is unchanged. The lungs are less well inflated than on the prior study with increased left midlung and bibasilar opacities. No sizable pleural effusion or pneumothorax is identified. IMPRESSION: Decreased lung volumes with increasing bibasilar opacities which may reflect atelectasis though pneumonia is not excluded. Electronically Signed   By: Logan Bores M.D.   On: 04/05/2016 07:58   Dg Chest Port 1 View  Result Date: 04/04/2016 CLINICAL DATA:  Status post aortobifemoral bypass surgery. EXAM: PORTABLE CHEST 1 VIEW COMPARISON:  Limited views of the chest from a thoracic spine series of February 3rd 2017 FINDINGS: The lungs are reasonably well inflated. There is left lower lobe atelectasis or  infiltrate which is stable. The cardiac silhouette is mildly enlarged. The pulmonary vascularity is not engorged. There is no pneumothorax nor large pleural effusion. There is dense calcification in the wall of the aortic arch. The esophagogastric tube tip projects in the gastric body in the proximal port below the GE junction. The right internal jugular Cordis sheath tip projects over the proximal SVC. IMPRESSION: Mild interstitial prominence bilaterally not greatly changed from previous study. Left lower lobe density also fairly stable which likely reflects chronic scarring/ bronchiectasis or atelectasis though superimposed pneumonia is not excluded. Thoracic aortic atherosclerosis. Electronically Signed   By: David  Martinique M.D.   On: 04/04/2016 13:06   Dg Abd Portable 1v  Result Date: 04/04/2016 CLINICAL DATA:  Status post aortobifemoral bypass. EXAM: PORTABLE ABDOMEN - 1 VIEW COMPARISON:  Body CT 12/10/2003 FINDINGS: Nasogastric tube tip overlaps the left upper quadrant/proximal stomach. Normal bowel gas pattern. No concerning mass effect or gas collection. Cholecystectomy clips. Left inguinal clips correlating with the history. IMPRESSION: Nasogastric tube tip over the proximal stomach. Electronically  Signed   By: Monte Fantasia M.D.   On: 04/04/2016 13:05     STUDIES:  CXR 04/05/16- Low lung volumes, bibasilar atelectasis.? pneumonia  CULTURES:   ANTIBIOTICS:  SIGNIFICANT EVENTS:  LINES/TUBES:   DISCUSSION: 70 year old with hypercarbia post aortofemoral bypass surgery.  ABG and chest x-ray reviewed. She may have a mixed respiratory, metabolic acidosis. I suspect she has poor respiratory effort from abdominal pain. She likely has underlying sleep apnea as she has symptoms of snoring and daytime sleepiness at baseline.  - She'll need to be mobilized - I have encouraged her to use the incentive spirometry. - Adequate pain control - Start Bipap as needed. Follow ABG in afternoon -  Rehceck met panel, LA. Consider bicarb supplementation.  - Low suspicion for pneumonia. We will check Procalitonin before deciding if she needs antibiotics.   We will continue to follow.  Marshell Garfinkel MD [facility] Pager [phone] If no answer or after 3pm call: [phone] 04/05/2016, 12:27 PM

## 2016-04-05 NOTE — Progress Notes (Signed)
Pt has been titrated to a 3L [place]. Tolerating it well. Pt wants a break from the NIV, pt has been coached in the BiPAP to take slow deep breaths, pt breaths very shallow respirations, decrease minute ventilation. Pt sats are stable at this time. No distress noted.

## 2016-04-05 NOTE — Evaluation (Signed)
Physical Therapy Evaluation Patient Details Name: Sylvia Rodriguez MRN: [id number] DOB: [birth year] Today's Date: 04/05/2016   History of Present Illness  70 yo female admitted with claudication s/p aortobifem BPG. PMHx: PVD, HTN, HLD  Clinical Impression  Pt pleasant with flat affect. Pt with decreased mobility, transfers and function who will benefit from acute therapy to maximize mobility, gait and independence to decrease burden of care. Pt reported 10/10 abdominal pain in supine, RN provided meds and pt stated pain greatly diminished prior to mobility. In standing pt with difficulty maintaining eyes open, reported dizziness and returned to sitting. BP 68/41 (45) HR 130 in sitting and pt returned to supine with BP 109/42 (60). Unable to progress mobility at this time due to hypotension with RN aware. Will follow acutely and recommend OOB to chair with nursing.     Follow Up Recommendations Home health PT;Supervision for mobility/OOB    Equipment Recommendations  Rolling walker with 5" wheels    Recommendations for Other Services       Precautions / Restrictions Precautions Precautions: Fall Precaution Comments: watch BP Restrictions Weight Bearing Restrictions: No      Mobility  Bed Mobility Overal bed mobility: Needs Assistance Bed Mobility: Rolling;Sidelying to Sit;Sit to Supine Rolling: Mod assist Sidelying to sit: Mod assist   Sit to supine: Mod assist   General bed mobility comments: cues for sequence with assist to rotate trunk and raise from surface as well as assist to bring legs back onto bed  Transfers Overall transfer level: Needs assistance   Transfers: Sit to/from Stand Sit to Stand: Min assist         General transfer comment: cues for hand placement and assist to rise, increased time to achieve upright posture with facilitation for hip extension. inability to maintain eyes open in standing and returned to sitting  Ambulation/Gait              General Gait Details: unable due to hypotension  Stairs            Wheelchair Mobility    Modified Rankin (Stroke Patients Only)       Balance Overall balance assessment: No apparent balance deficits (not formally assessed)                                           Pertinent Vitals/Pain Pain Assessment: No/denies pain Pain Score: 10-Worst pain ever Pain Location: stomach; received pain medication    Home Living Family/patient expects to be discharged to:: Private residence Living Arrangements: Spouse/significant other (truck driver, "gone a lot") Available Help at Discharge: Family Type of Home: House Home Access: Stairs to enter   Secretary/administratorntrance Stairs-Number of Steps: 6 Home Layout: One level Home Equipment: None      Prior Function Level of Independence: Independent         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      Hand Dominance        Extremity/Trunk Assessment   Upper Extremity Assessment: Overall WFL for tasks assessed           Lower Extremity Assessment: Overall WFL for tasks assessed      Cervical / Trunk Assessment: Normal  Communication   Communication: No difficulties  Cognition Arousal/Alertness: Lethargic Behavior During Therapy: WFL for tasks assessed/performed Overall Cognitive Status: Impaired/Different from baseline Area of Impairment: Attention   Current Attention Level: Sustained  General Comments      Exercises     Assessment/Plan    PT Assessment Patient needs continued PT services  PT Problem List Decreased mobility;Decreased activity tolerance;Decreased knowledge of use of DME;Cardiopulmonary status limiting activity          PT Treatment Interventions DME instruction;Gait training;Stair training;Functional mobility training;Therapeutic exercise;Patient/family education;Therapeutic activities    PT Goals (Current goals can be found in the Care Plan section)  Acute Rehab  PT Goals Patient Stated Goal: return home and to work PT Goal Formulation: With patient Time For Goal Achievement: 04/19/16 Potential to Achieve Goals: Good    Frequency Min 3X/week   Barriers to discharge   pt reports spouse works as a Naval architecttruck driver and could take off work to stay with her    Co-evaluation               End of Session Equipment Utilized During Treatment: Gait belt;Oxygen Activity Tolerance: Treatment limited secondary to medical complications (Comment) Patient left: in bed;with call bell/phone within reach Nurse Communication: Mobility status         Time: 0902-0933 PT Time Calculation (min) (ACUTE ONLY): 31 min   Charges:   PT Evaluation $PT Eval Moderate Complexity: 1 Procedure     PT G Codes:        Sylvia Rodriguez Sylvia Rodriguez 04/05/2016, 9:54 AM  Sylvia Rodriguez, PT [phone]

## 2016-04-05 NOTE — Progress Notes (Signed)
Dr. Arbie CookeyEarly paged regarding pts ABG: pH 7.11, CO2 58, HCO3 18.7. Dr. Arbie CookeyEarly to consult CCM.

## 2016-04-05 NOTE — Care Management Note (Addendum)
Case Management Note  Patient Details  Name: Sylvia Rodriguez MRN: [id number] Date of Birth: 26-May-1945  Subjective/Objective:      Pt admitted with hypercarbia and acidosis              Action/Plan:  Per pt; From home independent with husband , pt states she was working prior to admit.  CM will continue to follow for discharge needs   Expected Discharge Date:                  Expected Discharge Plan:     In-House Referral:     Discharge planning Services  CM Consult  Post Acute Care Choice:    Choice offered to:     DME Arranged:    DME Agency:     HH Arranged:    HH Agency:     Status of Service:  In process, will continue to follow  If discussed at Long Length of Stay Meetings, dates discussed:    Additional Comments: 04/08/2016 Pt ambulating with PT without issue- CM will continue to follow   04/05/16 CM assessed pt.  CM attempted to offer [facility] and pt became upset.  CM explained the [facility] and DME at this point and time during her recovery  and also informed pt that it was her choice.  Pt stated she didn't want to discuss it at that time, stated "I was working".  CM text paged PT for clarity regarding recommendation Cherylann ParrClaxton, Ellinore Merced S, RN 04/05/2016, 2:45 PM

## 2016-04-05 NOTE — Progress Notes (Signed)
RT/ RN encouraged pt to wear BIPAP due to increased PCO2. Pt did not want to wear it. Wore total maybe 1 hour. Another ABG drawn. PCO2 still elevated. MD aware. Will cont to monitor

## 2016-04-05 NOTE — Progress Notes (Addendum)
  [facility] Progress Note  Subjective  - POD #1  Doing ok. Had some nausea last night. Having some abdominal pain. No flatus.   Objective Vitals:   04/05/16 0630 04/05/16 0700  BP: (!) 119/54 (!) 126/58  Pulse: (!) 115 (!) 121  Resp: 10 11  Temp:  98.9 F (37.2 C)    Intake/Output Summary (Last 24 hours) at 04/05/16 0727 Last data filed at 04/05/16 0700  Gross per 24 hour  Intake          4327.92 ml  Output             2350 ml  Net          1977.92 ml   Sinus tachycardia 02 99% 2L, normal respiratory effort Abdomen with hypoactive BS, soft and non tender. Incisions clean. Brisk DP and PT doppler flow bilaterally.   Assessment/Planning: 70 y.o. female is s/p: aortobifemoral bypass 1 Day Post-Op   Had some nausea last night. No flatus. NG output 350 mL (irrigation 120). Keep NPO. Take NG out.  Sinus tachycardia rate 120s. BP is ok. Likely tachy secondary to pain.  Breathing comfortably on 2L. ABG with pH 7.28. CXR with increased density right lower field. Needs to get out of bed and mobilize. Incentive spirometry. Recheck ABG later this am.  ABLA: Hgb is stable this am.  Good UOP. Creatinine is normal.  Feet well perfused. Keep in ICU today.   Raymond GurneyKimberly A Trinh 04/05/2016 7:27 AM --  Laboratory CBC    Component Value Date/Time   WBC 7.7 04/05/2016 0225   HGB 11.4 (L) 04/05/2016 0225   HCT 34.0 (L) 04/05/2016 0225   PLT 151 04/05/2016 0225    BMET    Component Value Date/Time   NA 134 (L) 04/05/2016 0225   K 4.3 04/05/2016 0225   CL 100 (L) 04/05/2016 0225   CO2 27 04/05/2016 0225   GLUCOSE 232 (H) 04/05/2016 0225   BUN 9 04/05/2016 0225   CREATININE 0.73 04/05/2016 0225   CALCIUM 8.1 (L) 04/05/2016 0225   GFRNONAA >60 04/05/2016 0225   GFRAA >60 04/05/2016 0225    COAG Lab Results  Component Value Date   INR 1.25 04/04/2016   INR 1.03 03/25/2016   No results found for: PTT  Antibiotics Anti-infectives    Start      Dose/Rate Route Frequency Ordered Stop   04/04/16 2200  vancomycin (VANCOCIN) IVPB 1000 mg/200 mL premix     1,000 mg 200 mL/hr over 60 Minutes Intravenous  Once 04/04/16 1738 04/04/16 2313   04/04/16 1730  vancomycin (VANCOCIN) IVPB 1000 mg/200 mL premix  Status:  Discontinued     1,000 mg 200 mL/hr over 60 Minutes Intravenous Every 12 hours 04/04/16 1721 04/04/16 1738   04/04/16 0600  vancomycin (VANCOCIN) IVPB 1000 mg/200 mL premix     1,000 mg 200 mL/hr over 60 Minutes Intravenous 60 min pre-op 04/03/16 1358 04/04/16 0855       Maris BergerKimberly Trinh, PA-C [facility] Office: [phone] Pager: [phone] 04/05/2016 7:27 AM  I have examined the patient, reviewed and agree with above.Pickett amount of abdominal soreness. Poor respiratory effort. Will mobilize to chair. Respiratory acidosis. Will DC NG tube and keep nothing by mouth except for ice chips. Easily palpable popliteal pulses bilaterally. Brisk biphasic posterior tibial signals.  Gretta BeganEarly, Corinthian Mizrahi, MD 04/05/2016 8:39 AM

## 2016-04-05 NOTE — Progress Notes (Signed)
Placd pt on BIPAP per MD order due to high CO2 .

## 2016-04-05 NOTE — Progress Notes (Signed)
Pt refusing to wear BIPAP despite encouragement from RN. Has worn it for about an hour total this afternoon. MD made aware. ABG redrawn, results given to RT and Dr. Belia HemanKasa. Will cont to monitor.

## 2016-04-06 LAB — CBC
HEMATOCRIT: 31.5 % — AB (ref 36.0–46.0)
Hemoglobin: 10.3 g/dL — ABNORMAL LOW (ref 12.0–15.0)
MCH: 29.1 pg (ref 26.0–34.0)
MCHC: 32.7 g/dL (ref 30.0–36.0)
MCV: 89 fL (ref 78.0–100.0)
Platelets: 154 10*3/uL (ref 150–400)
RBC: 3.54 MIL/uL — ABNORMAL LOW (ref 3.87–5.11)
RDW: 14.9 % (ref 11.5–15.5)
WBC: 9.8 10*3/uL (ref 4.0–10.5)

## 2016-04-06 LAB — BASIC METABOLIC PANEL
Anion gap: 5 (ref 5–15)
BUN: 7 mg/dL (ref 6–20)
CHLORIDE: 102 mmol/L (ref 101–111)
CO2: 29 mmol/L (ref 22–32)
Calcium: 8.1 mg/dL — ABNORMAL LOW (ref 8.9–10.3)
Creatinine, Ser: 0.81 mg/dL (ref 0.44–1.00)
GFR calc Af Amer: >60 mL/min (ref >60)
GFR calc non Af Amer: >60 mL/min (ref >60)
GLUCOSE: 150 mg/dL — AB (ref 65–99)
POTASSIUM: 4.9 mmol/L (ref 3.5–5.1)
Sodium: 136 mmol/L (ref 135–145)

## 2016-04-06 LAB — POCT I-STAT 3, ART BLOOD GAS (G3+)
Acid-Base Excess: 4 mmol/L — ABNORMAL HIGH (ref 0.0–2.0)
Bicarbonate: 29.3 mmol/L — ABNORMAL HIGH (ref 20.0–28.0)
O2 Saturation: 82 %
TCO2: 31 mmol/L (ref 0–100)
pCO2 arterial: 48.6 mmHg — ABNORMAL HIGH (ref 32.0–48.0)
pH, Arterial: 7.388 (ref 7.350–7.450)
pO2, Arterial: 47 mmHg — ABNORMAL LOW (ref 83.0–108.0)

## 2016-04-06 LAB — PROCALCITONIN: Procalcitonin: 0.38 ng/mL

## 2016-04-06 MED ORDER — MORPHINE SULFATE (PF) 2 MG/ML IV SOLN
1.0000 mg | INTRAVENOUS | Status: DC | PRN
  Administered 2016-04-06: 2 mg via INTRAVENOUS
  Administered 2016-04-06 (×4): 1 mg via INTRAVENOUS
  Administered 2016-04-07 (×5): 2 mg via INTRAVENOUS
  Administered 2016-04-07: 1 mg via INTRAVENOUS
  Administered 2016-04-08 (×2): 2 mg via INTRAVENOUS
  Filled 2016-04-06 (×13): qty 1

## 2016-04-06 NOTE — Progress Notes (Addendum)
  [facility] Progress Note  Subjective  - POD #2  Having some trouble breathing. Abdomen hurts. Having some nausea. No flatus.   Objective Vitals:   04/06/16 0600 04/06/16 0700  BP: (!) 102/40 (!) 103/36  Pulse: (!) 123 100  Resp: 17 11  Temp:     02 98% Fi02 55% Venturi mask.   Intake/Output Summary (Last 24 hours) at 04/06/16 0735 Last data filed at 04/06/16 0700  Gross per 24 hour  Intake             3130 ml  Output             1290 ml  Net             1840 ml   Tachycardic Lungs with mild rales b/l Hypoactive bowel sounds, abdomen soft, mild tenderness RLQ Biphasic DP/PT doppler signals bilaterally.   Assessment/Planning: 70 y.o. female is s/p: aortobifemoral bypass 2 Days Post-Op   Respiratory acidosis significantly improved. Was on bipap last night. Now on venturi mask.  Appreciate PCCM seeing. Continue IS. Abdominal pain is causing shallow breaths. Lactic acid normal. Procalcitonin elevated but do not suspect infectious process at this time.  Change dilaudid to low dose morphine.  No bowel function yet. Keep NPO.  D/c a-line as it is not correlating. Good UOP.  Mobilize.  Keep in ICU today.   Sylvia Rodriguez 04/06/2016 7:35 AM -- Agree with above.  Main issues are pain control and pulmonary toilet.  Keep NPO since no real bowel function and nausea.  Hopefully ileus will resolve over next few days.  Sylvia Brunsharles Almarosa Bohac, MD [facility] [place] Office: [phone] Pager: [phone]  Laboratory CBC    Component Value Date/Time   WBC 7.7 04/05/2016 0225   HGB 11.4 (L) 04/05/2016 0225   HCT 34.0 (L) 04/05/2016 0225   PLT 151 04/05/2016 0225    BMET    Component Value Date/Time   NA 135 04/05/2016 1249   K 4.7 04/05/2016 1249   CL 102 04/05/2016 1249   CO2 27 04/05/2016 1249   GLUCOSE 172 (H) 04/05/2016 1249   BUN 9 04/05/2016 1249   CREATININE 0.77 04/05/2016 1249   CALCIUM 8.1 (L) 04/05/2016 1249   GFRNONAA >60 04/05/2016 1249   GFRAA >60 04/05/2016 1249    COAG Lab Results  Component Value Date   INR 1.25 04/04/2016   INR 1.03 03/25/2016   No results found for: PTT  Antibiotics Anti-infectives    Start     Dose/Rate Route Frequency Ordered Stop   04/04/16 2200  vancomycin (VANCOCIN) IVPB 1000 mg/200 mL premix     1,000 mg 200 mL/hr over 60 Minutes Intravenous  Once 04/04/16 1738 04/04/16 2313   04/04/16 1730  vancomycin (VANCOCIN) IVPB 1000 mg/200 mL premix  Status:  Discontinued     1,000 mg 200 mL/hr over 60 Minutes Intravenous Every 12 hours 04/04/16 1721 04/04/16 1738   04/04/16 0600  vancomycin (VANCOCIN) IVPB 1000 mg/200 mL premix     1,000 mg 200 mL/hr over 60 Minutes Intravenous 60 min pre-op 04/03/16 1358 04/04/16 0855       Sylvia BergerKimberly Trinh, PA-C [facility] Office: [phone] Pager: [phone] 04/06/2016 7:35 AM

## 2016-04-06 NOTE — Progress Notes (Signed)
OT Cancellation Note  Patient Details Name: Sylvia Rodriguez MRN: [id number] DOB: 06/18/1945   Cancelled Treatment:    Reason Eval/Treat Not Completed: Fatigue/lethargy limiting ability to participate.  Pt had just finished with PT. Will reattempt.  Favor Kreh Leamingtononarpe, OTR/L [phone]   Jeani HawkingConarpe, Tabari Volkert M 04/06/2016, 3:41 PM

## 2016-04-06 NOTE — Progress Notes (Signed)
Physical Therapy Treatment Patient Details Name: Sylvia Rodriguez MRN: [id number] DOB: 1945-08-09 Today's Date: 04/06/2016    History of Present Illness 70 yo female admitted with claudication s/p aortobifem BPG. PMHx: PVD, HTN, HLD    PT Comments    Pt much improved from yesterday. Tolerate ambulation with eva walker, would recommend standard RW next session. Pt given HEP however pt became lethargic and unable to participate due to pt receiving IV pain medicine. RN aware and agreed to encourage pt to do them.   Follow Up Recommendations  Home health PT;Supervision for mobility/OOB     Equipment Recommendations  Rolling walker with 5" wheels    Recommendations for Other Services       Precautions / Restrictions Precautions Precautions: Fall Restrictions Weight Bearing Restrictions: No    Mobility  Bed Mobility               General bed mobility comments: pt up OOB upon PT arrival  Transfers Overall transfer level: Needs assistance   Transfers: Sit to/from Stand Sit to Stand: Min assist         General transfer comment: v/c's to reach back for chair with one hand, slow/guided, increased time to scoot back in chair  Ambulation/Gait Ambulation/Gait assistance: Min assist;+2 safety/equipment Ambulation Distance (Feet): 120 Feet Assistive device:  (eva walker) Gait Pattern/deviations: Step-through pattern Gait velocity: pt to slow pt down Gait velocity interpretation: Below normal speed for age/gender General Gait Details: RN using eva walker with pt, lowered height for optimal positioning and to promote upright position via bilat UE extension. Pt was significantly leaning on RW prior to PT intervention. Pt required 2 standign rest breaks. minA from PT to prevent walker from advancing to quickly due to 4 wheels   Stairs            Wheelchair Mobility    Modified Rankin (Stroke Patients Only)       Balance Overall balance assessment:  (needs RW  for safe standing and ambulation)                                  Cognition Arousal/Alertness: Awake/alert Behavior During Therapy: WFL for tasks assessed/performed Overall Cognitive Status: Within Functional Limits for tasks assessed                      Exercises General Exercises - Lower Extremity Ankle Circles/Pumps: AROM;Both;10 reps;Seated Quad Sets: AROM;Both;5 reps;Seated Gluteal Sets: AROM;Both;5 reps;Seated    General Comments General comments (skin integrity, edema, etc.): pt on non-re-breather mask      Pertinent Vitals/Pain Pain Assessment: Faces Faces Pain Scale: Hurts little more Pain Location: abdomen    Home Living                      Prior Function            PT Goals (current goals can now be found in the care plan section) Acute Rehab PT Goals Patient Stated Goal: home Progress towards PT goals: Progressing toward goals    Frequency    Min 3X/week      PT Plan Current plan remains appropriate    Co-evaluation             End of Session Equipment Utilized During Treatment: Gait belt;Oxygen Activity Tolerance: Patient tolerated treatment well Patient left: in chair;with call bell/phone within reach;with nursing/sitter in room  Time: [id number] PT Time Calculation (min) (ACUTE ONLY): 15 min  Charges:  $Gait Training: 8-22 mins                    G Codes:      Iona Hansenshly M Daaiel Starlin 04/06/2016, 11:36 AM   Lewis ShockAshly Novia Lansberry, PT, DPT Pager #: [phone] Office #: [phone]

## 2016-04-06 NOTE — Consult Note (Signed)
PULMONARY / CRITICAL CARE MEDICINE   Name: Sylvia Rodriguez MRN: [id number] DOB: 09-May-1946    ADMISSION DATE:  04/04/2016 CONSULTATION DATE:  04/05/16  REFERRING MD:  Gretta Beganodd Early MD  CHIEF COMPLAINT:  HYpercarbia, acidosis  HISTORY OF PRESENT ILLNESS:   70 year old with past medical history of arthritis, hypertension, peripheral artery disease. She was admitted yesterday for aortobifemoral bypass. ABGs today a.m. show worsening acidosis with hypercarbia. PCCM consulted for further evaluation.  PAST MEDICAL HISTORY :  She  has a past medical history of Arthritis; Complication of anesthesia; Essential hypertension (02/19/2016); History of pneumonia; Hyperlipidemia (02/19/2016); and Peripheral arterial disease (HCC) (02/19/2016).  PAST SURGICAL HISTORY: She  has a past surgical history that includes Cardiac catheterization (N/A, 01/27/2016); Colonoscopy; Cataract extraction w/ intraocular lens implant (Bilateral); Ankle surgery; Appendectomy; Tonsillectomy; Cyst removal hand; Oophorectomy; and Aorta - bilateral femoral artery bypass graft (Bilateral, 04/04/2016).  Allergies  Allergen Reactions  . Penicillins Hives     Has patient had a PCN reaction causing immediate rash, facial/tongue/throat swelling, SOB or lightheadedness with hypotension:  # # YES # #  Has patient had a PCN reaction causing severe rash involving mucus membranes or skin necrosis: No Has patient had a PCN reaction that required hospitalization No Has patient had a PCN reaction occurring within the last 10 years: No If all of the above answers are "NO", then may proceed with Cephalosporin use.    . Aspirin Nausea And Vomiting  . Codeine Other (See Comments)    Bone Pain    No current facility-administered medications on file prior to encounter.    Current Outpatient Prescriptions on File Prior to Encounter  Medication Sig  . atorvastatin (LIPITOR) 10 MG tablet Take 10 mg by mouth at bedtime.   . cilostazol  (PLETAL) 100 MG tablet Take 100 mg by mouth 2 (two) times daily.  . clopidogrel (PLAVIX) 75 MG tablet Take 75 mg by mouth daily.  . diazepam (VALIUM) 2 MG tablet Take 2 mg by mouth 4 (four) times daily as needed (for spasms/cramping.).   Marland Kitchen. diclofenac sodium (VOLTAREN) 1 % GEL Apply 2 g topically daily as needed (shoulder pain).   . Multiple Vitamins-Minerals (TH COMPLETE MULTI PO) Take 30 mLs by mouth daily.   Marland Kitchen. omeprazole (PRILOSEC) 40 MG capsule Take 40 mg by mouth daily.  . pentoxifylline (TRENTAL) 400 MG CR tablet Take 400 mg by mouth daily.   . traMADol (ULTRAM) 50 MG tablet Take 50 mg by mouth every 6 (six) hours as needed (for pain.).     FAMILY HISTORY:  Her indicated that her mother is deceased. She indicated that her father is deceased. She indicated that the status of her maternal aunt is unknown. She indicated that her maternal uncle is deceased.    SOCIAL HISTORY: She  reports that she quit smoking about 5 years ago. She has never used smokeless tobacco. She reports that she does not drink alcohol or use drugs.  REVIEW OF SYSTEMS:   Complains of abdominal pain, nausea. Denies any chest pain, palpitation, dyspnea Has white mucus production. No fevers, chills.  SUBJECTIVE:    VITAL SIGNS: BP (!) 90/31   Pulse (!) 125   Temp (!) 101 F (38.3 C) (Axillary)   Resp 20   Ht 5\' 1"  (1.549 m)   Wt 116 lb 2 oz (52.7 kg)   SpO2 (!) 88%   BMI 21.94 kg/m   HEMODYNAMICS:    VENTILATOR SETTINGS: Vent Mode: BIPAP;PCV FiO2 (%):  [50 %-55 %]  55 % Set Rate:  [14 bmp] 14 bmp PEEP:  [5 cmH20] 5 cmH20  INTAKE / OUTPUT: I/O last 3 completed shifts: In: 4787.9 [P.O.:30; I.V.:4447.9; NG/GT:110; IV Piggyback:200] Out: 2220 [Urine:1880; Emesis/NG output:340]  PHYSICAL EXAMINATION: General:  Awake, alert, oriented, mild distress Neuro:  No focal deficits HEENT:  PERRLA, no thyromegaly, JVD Cardiovascular: Regular rate and rhythm, no MRG  Lungs:  No wheeze, b/l  crackles Abdomen: Soft, + BS Musculoskeletal:  Normal tone and bulk, no edema Skin:  Intact  LABS:  BMET  Recent Labs Lab 04/05/16 0225 04/05/16 1249 04/06/16 0812  NA 134* 135 136  K 4.3 4.7 4.9  CL 100* 102 102  CO2 27 27 29   BUN 9 9 7   CREATININE 0.73 0.77 0.81  GLUCOSE 232* 172* 150*    Electrolytes  Recent Labs Lab 04/04/16 1200 04/05/16 0225 04/05/16 1249 04/06/16 0812  CALCIUM 8.3* 8.1* 8.1* 8.1*  MG 1.3* 1.4*  --   --     CBC  Recent Labs Lab 04/04/16 1200 04/05/16 0225 04/06/16 0812  WBC 8.8 7.7 9.8  HGB 10.8* 11.4* 10.3*  HCT 31.9* 34.0* 31.5*  PLT 142* 151 154    Coag's  Recent Labs Lab 04/04/16 1240  APTT 28  INR 1.25    Sepsis Markers  Recent Labs Lab 04/05/16 1230 04/05/16 1249 04/06/16 0353  LATICACIDVEN 1.3  --   --   PROCALCITON  --  0.25 0.38    ABG  Recent Labs Lab 04/05/16 1117 04/05/16 1720 04/06/16 0459  PHART 7.115* 7.353 7.388  PCO2ART 58.3* 65.4* 48.6*  PO2ART 60.0* 110.0* 47.0*    Liver Enzymes  Recent Labs Lab 04/05/16 0225 04/05/16 1249  AST 25 22  ALT 15 14  ALKPHOS 43 47  BILITOT 0.6 0.6  ALBUMIN 3.1* 3.1*    Cardiac Enzymes No results for input(s): TROPONINI, PROBNP in the last 168 hours.  Glucose No results for input(s): GLUCAP in the last 168 hours.  Imaging No results found.   STUDIES:  CXR 04/05/16- Low lung volumes, bibasilar atelectasis.? pneumonia  CULTURES:   ANTIBIOTICS:  SIGNIFICANT EVENTS:  LINES/TUBES:   DISCUSSION: 70 year old with hypercarbia post aortofemoral bypass surgery.  ABG and chest x-ray reviewed. She may have a mixed respiratory, metabolic acidosis. I suspect she has poor respiratory effort from abdominal pain. She likely has underlying sleep apnea as she has symptoms of snoring and daytime sleepiness at baseline.  - Mobilization, incentive spirometry, pain control - Bipap as needed and mandatory at night.   We will continue to  follow.  Chilton GreathousePraveen Jacora Hopkins MD [facility] Pager [phone] If no answer or after 3pm call: [phone] 04/06/2016, 11:53 AM

## 2016-04-07 LAB — BASIC METABOLIC PANEL
Anion gap: 4 — ABNORMAL LOW (ref 5–15)
BUN: 5 mg/dL — AB (ref 6–20)
CHLORIDE: 104 mmol/L (ref 101–111)
CO2: 30 mmol/L (ref 22–32)
Calcium: 8 mg/dL — ABNORMAL LOW (ref 8.9–10.3)
Creatinine, Ser: 0.68 mg/dL (ref 0.44–1.00)
Glucose, Bld: 143 mg/dL — ABNORMAL HIGH (ref 65–99)
POTASSIUM: 4.3 mmol/L (ref 3.5–5.1)
SODIUM: 138 mmol/L (ref 135–145)

## 2016-04-07 LAB — CBC
HEMATOCRIT: 28.5 % — AB (ref 36.0–46.0)
Hemoglobin: 9 g/dL — ABNORMAL LOW (ref 12.0–15.0)
MCH: 28.2 pg (ref 26.0–34.0)
MCHC: 31.6 g/dL (ref 30.0–36.0)
MCV: 89.3 fL (ref 78.0–100.0)
PLATELETS: 123 10*3/uL — AB (ref 150–400)
RBC: 3.19 MIL/uL — AB (ref 3.87–5.11)
RDW: 15 % (ref 11.5–15.5)
WBC: 7.8 10*3/uL (ref 4.0–10.5)

## 2016-04-07 LAB — PROCALCITONIN: PROCALCITONIN: 0.24 ng/mL

## 2016-04-07 NOTE — Progress Notes (Signed)
[facility] [place]  Subjective  - pain improving, still no flatus, wants something to drink   Objective (!) 99/59 94 98.4 F (36.9 C) (Oral) 14 100%  Intake/Output Summary (Last 24 hours) at 04/07/16 [id number] Last data filed at 04/07/16 0600  Gross per 24 hour  Intake             2850 ml  Output             2405 ml  Net              445 ml   Abdomen soft no incisional drainage from abdomen or groins Resp: much less work of breathing Card: less tachy  Assessment/Planning: S/p aortobifem Transfer 2w Continue pulmonary toilet and ambulation Start clears Remove all but one IV Decrease IV fluid rate  Fabienne BrunsFields, Charles 04/07/2016 8:21 AM --  Laboratory Lab Results:  Recent Labs  04/06/16 0812 04/07/16 0356  WBC 9.8 7.8  HGB 10.3* 9.0*  HCT 31.5* 28.5*  PLT 154 123*   BMET  Recent Labs  04/06/16 0812 04/07/16 0356  NA 136 138  K 4.9 4.3  CL 102 104  CO2 29 30  GLUCOSE 150* 143*  BUN 7 5*  CREATININE 0.81 0.68  CALCIUM 8.1* 8.0*    COAG Lab Results  Component Value Date   INR 1.25 04/04/2016   INR 1.03 03/25/2016   No results found for: PTT

## 2016-04-08 LAB — CBC
HCT: 28.8 % — ABNORMAL LOW (ref 36.0–46.0)
Hemoglobin: 9.3 g/dL — ABNORMAL LOW (ref 12.0–15.0)
MCH: 29 pg (ref 26.0–34.0)
MCHC: 32.3 g/dL (ref 30.0–36.0)
MCV: 89.7 fL (ref 78.0–100.0)
PLATELETS: 143 10*3/uL — AB (ref 150–400)
RBC: 3.21 MIL/uL — AB (ref 3.87–5.11)
RDW: 14.7 % (ref 11.5–15.5)
WBC: 5.7 10*3/uL (ref 4.0–10.5)

## 2016-04-08 LAB — BASIC METABOLIC PANEL
Anion gap: 6 (ref 5–15)
BUN: <5 mg/dL — ABNORMAL LOW (ref 6–20)
CALCIUM: 8.4 mg/dL — AB (ref 8.9–10.3)
CO2: 30 mmol/L (ref 22–32)
CREATININE: 0.58 mg/dL (ref 0.44–1.00)
Chloride: 102 mmol/L (ref 101–111)
GFR calc non Af Amer: >60 mL/min (ref >60)
Glucose, Bld: 123 mg/dL — ABNORMAL HIGH (ref 65–99)
Potassium: 3.9 mmol/L (ref 3.5–5.1)
SODIUM: 138 mmol/L (ref 135–145)

## 2016-04-08 MED ORDER — OXYCODONE-ACETAMINOPHEN 5-325 MG PO TABS
1.0000 | ORAL_TABLET | ORAL | Status: DC | PRN
  Administered 2016-04-08: 1 via ORAL
  Administered 2016-04-08: 2 via ORAL
  Administered 2016-04-08: 1 via ORAL
  Administered 2016-04-09 – 2016-04-10 (×4): 2 via ORAL
  Filled 2016-04-08: qty 2
  Filled 2016-04-08 (×2): qty 1
  Filled 2016-04-08 (×4): qty 2

## 2016-04-08 NOTE — Progress Notes (Signed)
Physical Therapy Treatment Patient Details Name: Sylvia Rodriguez MRN: [id number] DOB: [birth year] Today's Date: 04/08/2016    History of Present Illness 70 yo female admitted with claudication s/p aortobifem BPG. PMHx: PVD, HTN, HLD    PT Comments    Patient was pleasant and fully A&O today. Patient walked approximately 200 feet without an AD and did not note fatigue when asked. She performed stairs for the first time since hospitalization today, and would benefit from more stair training to practice form and proper ascending/descending with rails. Patient noted feeling tired after walking and exercises, but reported no increased pain. Overall, patient is progressing towards her goals well. Will continue to follow.    Follow Up Recommendations  Home health PT;Supervision for mobility/OOB     Equipment Recommendations       Recommendations for Other Services       Precautions / Restrictions Precautions Precautions: Fall    Mobility  Bed Mobility Overal bed mobility: Modified Independent Bed Mobility: Sit to Supine Rolling: Modified independent (Device/Increase time)     Sit to supine: Modified independent (Device/Increase time)   General bed mobility comments: Patient was EOB upon arrival, but required increased time to get back into bed after session.   Transfers Overall transfer level: Modified independent Equipment used: Rolling walker (2 wheeled) Transfers: Sit to/from Stand Sit to Stand: Modified independent (Device/Increase time)            Ambulation/Gait Ambulation/Gait assistance: Min guard Ambulation Distance (Feet): 500 Feet Assistive device: Rolling walker (2 wheeled) (began walking without AD midwalk ) Gait Pattern/deviations: Step-through pattern;Trunk flexed Gait velocity: decreased  Gait velocity interpretation: Below normal speed for age/gender General Gait Details: Patient given two cues to lift chest, extend hips, and walk along straight  path.    Stairs Stairs: Yes Stairs assistance: Min guard Stair Management: Two rails Number of Stairs: 7 General stair comments: patient had more difficulty with descending stairs, noting leg L knee pain and taking increased time. Patient was instructed to descend stairs starting with weaker/more painful leg.   Wheelchair Mobility    Modified Rankin (Stroke Patients Only)       Balance Overall balance assessment: Modified Independent                                  Cognition Arousal/Alertness: Awake/alert Behavior During Therapy: WFL for tasks assessed/performed Overall Cognitive Status: Within Functional Limits for tasks assessed     Current Attention Level: Sustained;Focused                Exercises General Exercises - Lower Extremity Long Arc Quad: AROM;Both;15 reps;Seated Hip Flexion/Marching: AROM;Both;15 reps;Seated    General Comments        Pertinent Vitals/Pain Pain Assessment: 0-10 Pain Score: 7  Pain Location: legs     Home Living                      Prior Function            PT Goals (current goals can now be found in the care plan section) Acute Rehab PT Goals PT Goal Formulation: With patient Time For Goal Achievement: 04/22/16 Potential to Achieve Goals: Good Progress towards PT goals: Progressing toward goals    Frequency    Min 3X/week      PT Plan Current plan remains appropriate    Co-evaluation  End of Session Equipment Utilized During Treatment: Gait belt Activity Tolerance: Patient tolerated treatment well;No increased pain;Patient limited by fatigue Patient left: in bed;with call bell/phone within reach;with bed alarm set     Time: 0940-1002 PT Time Calculation (min) (ACUTE ONLY): 22 min  Charges:  $Gait Training: 8-22 mins                    G Codes:      Tene Gato 04/08/2016, 10:45 AM  Kaycen Whitworth SPT [phone]

## 2016-04-08 NOTE — Progress Notes (Addendum)
  [facility] Progress Note  Subjective  - POD #4  Breathing is more comfortable. Still with abdominal pain. No flatus. No BM.    Objective Vitals:   04/07/16 2033 04/08/16 0503  BP: (!) 108/46 (!) 102/40  Pulse: 95 97  Resp: 16 16  Temp: 99.4 F (37.4 C) 98.8 F (37.1 C)    Intake/Output Summary (Last 24 hours) at 04/08/16 0740 Last data filed at 04/08/16 [id number]  Gross per 24 hour  Intake           873.75 ml  Output             2000 ml  Net         -1126.25 ml   Mild crackles b/l Abdomen soft and non-tender, hypoactive bowel sounds Incisions clean Feet warm bilaterally   Assessment/Planning: 70 y.o. female is s/p: aortobifemoral bypass 4 Days Post-Op   Tolerating clears. No BM or flatus yet. Will continue clears until bowel function returns. Saline lock IV.  Continue pulmonary toilet and ambulation. Will hold on restarting BP meds as BP is soft.   Raymond GurneyKimberly A Trinh 04/08/2016 7:40 AM --  + flatus, some abd cramping Incisions clean Will advance to regular diet Probably home 11/26  Fabienne Brunsharles Fields, MD [facility] [place] Office: [phone] Pager: [phone]   Laboratory CBC    Component Value Date/Time   WBC 5.7 04/08/2016 0317   HGB 9.3 (L) 04/08/2016 0317   HCT 28.8 (L) 04/08/2016 0317   PLT 143 (L) 04/08/2016 0317    BMET    Component Value Date/Time   NA 138 04/08/2016 0317   K 3.9 04/08/2016 0317   CL 102 04/08/2016 0317   CO2 30 04/08/2016 0317   GLUCOSE 123 (H) 04/08/2016 0317   BUN <5 (L) 04/08/2016 0317   CREATININE 0.58 04/08/2016 0317   CALCIUM 8.4 (L) 04/08/2016 0317   GFRNONAA >60 04/08/2016 0317   GFRAA >60 04/08/2016 0317    COAG Lab Results  Component Value Date   INR 1.25 04/04/2016   INR 1.03 03/25/2016   No results found for: PTT  Antibiotics Anti-infectives    Start     Dose/Rate Route Frequency Ordered Stop   04/04/16 2200  vancomycin (VANCOCIN) IVPB 1000 mg/200 mL  premix     1,000 mg 200 mL/hr over 60 Minutes Intravenous  Once 04/04/16 1738 04/04/16 2313   04/04/16 1730  vancomycin (VANCOCIN) IVPB 1000 mg/200 mL premix  Status:  Discontinued     1,000 mg 200 mL/hr over 60 Minutes Intravenous Every 12 hours 04/04/16 1721 04/04/16 1738   04/04/16 0600  vancomycin (VANCOCIN) IVPB 1000 mg/200 mL premix     1,000 mg 200 mL/hr over 60 Minutes Intravenous 60 min pre-op 04/03/16 1358 04/04/16 0855       Maris BergerKimberly Trinh, PA-C [facility] Office: [phone] Pager: [phone] 04/08/2016 7:40 AM

## 2016-04-09 LAB — CBC
HEMATOCRIT: 26.9 % — AB (ref 36.0–46.0)
Hemoglobin: 8.9 g/dL — ABNORMAL LOW (ref 12.0–15.0)
MCH: 29 pg (ref 26.0–34.0)
MCHC: 33.1 g/dL (ref 30.0–36.0)
MCV: 87.6 fL (ref 78.0–100.0)
Platelets: 150 10*3/uL (ref 150–400)
RBC: 3.07 MIL/uL — AB (ref 3.87–5.11)
RDW: 14.2 % (ref 11.5–15.5)
WBC: 4.8 10*3/uL (ref 4.0–10.5)

## 2016-04-09 LAB — BASIC METABOLIC PANEL
Anion gap: 7 (ref 5–15)
BUN: 6 mg/dL (ref 6–20)
CHLORIDE: 100 mmol/L — AB (ref 101–111)
CO2: 31 mmol/L (ref 22–32)
Calcium: 8.5 mg/dL — ABNORMAL LOW (ref 8.9–10.3)
Creatinine, Ser: 0.71 mg/dL (ref 0.44–1.00)
GFR calc non Af Amer: >60 mL/min (ref >60)
Glucose, Bld: 102 mg/dL — ABNORMAL HIGH (ref 65–99)
POTASSIUM: 3.4 mmol/L — AB (ref 3.5–5.1)
SODIUM: 138 mmol/L (ref 135–145)

## 2016-04-09 MED ORDER — DIAZEPAM 2 MG PO TABS: 2.0000 mg | ORAL_TABLET | Freq: Four times a day (QID) | ORAL | Status: DC | PRN

## 2016-04-09 MED ORDER — LISINOPRIL 5 MG PO TABS
5.0000 mg | ORAL_TABLET | Freq: Every day | ORAL | Status: DC
  Administered 2016-04-09 – 2016-04-10 (×2): 5 mg via ORAL
  Filled 2016-04-09 (×2): qty 1

## 2016-04-09 MED ORDER — POTASSIUM CHLORIDE CRYS ER 20 MEQ PO TBCR
20.0000 meq | EXTENDED_RELEASE_TABLET | Freq: Once | ORAL | Status: AC
  Administered 2016-04-09: 20 meq via ORAL
  Filled 2016-04-09: qty 1

## 2016-04-09 MED ORDER — CLOPIDOGREL BISULFATE 75 MG PO TABS
75.0000 mg | ORAL_TABLET | Freq: Every day | ORAL | Status: DC
  Administered 2016-04-09 – 2016-04-10 (×2): 75 mg via ORAL
  Filled 2016-04-09 (×2): qty 1

## 2016-04-09 MED ORDER — BISACODYL 10 MG RE SUPP
10.0000 mg | Freq: Once | RECTAL | Status: DC
  Filled 2016-04-09: qty 1

## 2016-04-09 MED ORDER — AMLODIPINE BESYLATE 5 MG PO TABS
5.0000 mg | ORAL_TABLET | Freq: Every day | ORAL | Status: DC
  Administered 2016-04-09 – 2016-04-10 (×2): 5 mg via ORAL
  Filled 2016-04-09 (×2): qty 1

## 2016-04-09 MED ORDER — PENTOXIFYLLINE ER 400 MG PO TBCR
400.0000 mg | EXTENDED_RELEASE_TABLET | Freq: Every day | ORAL | Status: DC
  Administered 2016-04-09 – 2016-04-10 (×2): 400 mg via ORAL
  Filled 2016-04-09 (×3): qty 1

## 2016-04-09 MED ORDER — ATORVASTATIN CALCIUM 10 MG PO TABS
10.0000 mg | ORAL_TABLET | Freq: Every day | ORAL | Status: DC
  Administered 2016-04-09: 10 mg via ORAL
  Filled 2016-04-09: qty 1

## 2016-04-09 NOTE — Progress Notes (Addendum)
  [facility] Progress Note  Subjective  - POD #5  No nausea. Still with some abdominal pain. Passing flatus. No BM.  Objective Vitals:   04/08/16 2126 04/09/16 0518  BP: (!) 130/54 (!) 122/51  Pulse: (!) 104 100  Resp: 18 18  Temp: 98.6 F (37 C) 98.1 F (36.7 C)    Intake/Output Summary (Last 24 hours) at 04/09/16 0754 Last data filed at 04/08/16 1800  Gross per 24 hour  Intake              240 ml  Output                0 ml  Net              240 ml    Abdomen soft. Mild diffuse tenderness.  Incisions clean. Faintly palpable DP pulses b/l  Assessment/Planning: 70 y.o. female is s/p: aortobifemoral bypass 5 Days Post-Op   Tolerating regular diet. Ambulate. Replete hypokalemia. Suppository today. Restart home BP meds.  Likely d/c home tomorrow.   Raymond GurneyKimberly A Trinh 04/09/2016 7:54 AM -- Agree with above.  Home tomorrow if bowel function ok and continues to tolerate diet.  Fabienne Brunsharles Fields, MD [facility] [place] Office: [phone] Pager: [phone]  Laboratory CBC    Component Value Date/Time   WBC 4.8 04/09/2016 0213   HGB 8.9 (L) 04/09/2016 0213   HCT 26.9 (L) 04/09/2016 0213   PLT 150 04/09/2016 0213    BMET    Component Value Date/Time   NA 138 04/09/2016 0213   K 3.4 (L) 04/09/2016 0213   CL 100 (L) 04/09/2016 0213   CO2 31 04/09/2016 0213   GLUCOSE 102 (H) 04/09/2016 0213   BUN 6 04/09/2016 0213   CREATININE 0.71 04/09/2016 0213   CALCIUM 8.5 (L) 04/09/2016 0213   GFRNONAA >60 04/09/2016 0213   GFRAA >60 04/09/2016 0213    COAG Lab Results  Component Value Date   INR 1.25 04/04/2016   INR 1.03 03/25/2016   No results found for: PTT  Antibiotics Anti-infectives    Start     Dose/Rate Route Frequency Ordered Stop   04/04/16 2200  vancomycin (VANCOCIN) IVPB 1000 mg/200 mL premix     1,000 mg 200 mL/hr over 60 Minutes Intravenous  Once 04/04/16 1738 04/04/16 2313   04/04/16 1730   vancomycin (VANCOCIN) IVPB 1000 mg/200 mL premix  Status:  Discontinued     1,000 mg 200 mL/hr over 60 Minutes Intravenous Every 12 hours 04/04/16 1721 04/04/16 1738   04/04/16 0600  vancomycin (VANCOCIN) IVPB 1000 mg/200 mL premix     1,000 mg 200 mL/hr over 60 Minutes Intravenous 60 min pre-op 04/03/16 1358 04/04/16 0855       Maris BergerKimberly Trinh, PA-C [facility] Office: [phone] Pager: [phone] 04/09/2016 7:54 AM

## 2016-04-09 NOTE — Progress Notes (Signed)
Occupational Therapy Treatment Patient Details Name: Sylvia Rodriguez MRN: [id number] DOB: 12/23/1945 Today's Date: 04/09/2016    History of present illness 70 yo female admitted with claudication s/p aortobifem BPG. PMHx: PVD, HTN, HLD   OT comments  Pt demonstrated independence in ADL and mobility within her room/bathroom. She is quite fastidious in her self care. No OT needs.  Follow Up Recommendations  No OT follow up    Equipment Recommendations  None recommended by OT    Recommendations for Other Services      Precautions / Restrictions Precautions Precautions: Fall Restrictions Weight Bearing Restrictions: No       Mobility Bed Mobility               General bed mobility comments: seated EOB  Transfers Overall transfer level: Independent Equipment used: None                  Balance                                   ADL Overall ADL's : Modified independent                                       General ADL Comments: pt performed toileting, sponge bathing, dressing and grooming independently.      Vision                     Perception     Praxis      Cognition   Behavior During Therapy: WFL for tasks assessed/performed Overall Cognitive Status: Within Functional Limits for tasks assessed                       Extremity/Trunk Assessment  Upper Extremity Assessment Upper Extremity Assessment: Overall WFL for tasks assessed   Lower Extremity Assessment Lower Extremity Assessment: Defer to PT evaluation   Cervical / Trunk Assessment Cervical / Trunk Assessment: Normal    Exercises     Shoulder Instructions       General Comments      Pertinent Vitals/ Pain       Pain Assessment: Faces Faces Pain Scale: Hurts a little bit Pain Location: abdomen Pain Descriptors / Indicators: Sore Pain Intervention(s): Monitored during session  Home Living Family/patient expects to be  discharged to:: Private residence Living Arrangements: Spouse/significant other Available Help at Discharge: Family Type of Home: House Home Access: Stairs to enter Secretary/administratorntrance Stairs-Number of Steps: 6   Home Layout: One level     Bathroom Shower/Tub: Chief Strategy OfficerTub/shower unit   Bathroom Toilet: Standard     Home Equipment: None          Prior Functioning/Environment Level of Independence: Independent        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    Frequency           Progress Toward Goals  OT Goals(current goals can now be found in the care plan section)     Acute Rehab OT Goals Patient Stated Goal: home  Plan      Co-evaluation                 End of Session     Activity Tolerance Patient tolerated treatment well   Patient Left in bed;with call bell/phone within reach  Nurse Communication          Time: 6213-08650929-1000 OT Time Calculation (min): 31 min  Charges: OT General Charges $OT Visit: 1 Procedure OT Evaluation $OT Eval Low Complexity: 1 Procedure OT Treatments $Self Care/Home Management : 8-22 mins  Evern BioMayberry, Kolin Erdahl Lynn 04/09/2016, 10:11 AM  [phone]

## 2016-04-10 LAB — CBC
HCT: 27.8 % — ABNORMAL LOW (ref 36.0–46.0)
Hemoglobin: 9.5 g/dL — ABNORMAL LOW (ref 12.0–15.0)
MCH: 29.3 pg (ref 26.0–34.0)
MCHC: 34.2 g/dL (ref 30.0–36.0)
MCV: 85.8 fL (ref 78.0–100.0)
Platelets: 182 10*3/uL (ref 150–400)
RBC: 3.24 MIL/uL — ABNORMAL LOW (ref 3.87–5.11)
RDW: 14.3 % (ref 11.5–15.5)
WBC: 4.9 10*3/uL (ref 4.0–10.5)

## 2016-04-10 LAB — BASIC METABOLIC PANEL
Anion gap: 6 (ref 5–15)
BUN: 7 mg/dL (ref 6–20)
CALCIUM: 8.8 mg/dL — AB (ref 8.9–10.3)
CO2: 30 mmol/L (ref 22–32)
Chloride: 102 mmol/L (ref 101–111)
Creatinine, Ser: 0.72 mg/dL (ref 0.44–1.00)
GFR calc Af Amer: >60 mL/min (ref >60)
GLUCOSE: 100 mg/dL — AB (ref 65–99)
Potassium: 4 mmol/L (ref 3.5–5.1)
Sodium: 138 mmol/L (ref 135–145)

## 2016-04-10 MED ORDER — TRAMADOL HCL 50 MG PO TABS
50.0000 mg | ORAL_TABLET | Freq: Four times a day (QID) | ORAL | 0 refills | Status: DC | PRN
Start: 2016-04-10 — End: 2016-05-03

## 2016-04-10 NOTE — Care Management Note (Signed)
Case Management Note  Patient Details  Name: Valarie Coneslizabeth A Plake MRN: [id number] Date of Birth: 1945-08-31  Subjective/Objective:                  s/p: aortobifemoral bypass  Action/Plan: CM spoke to patient and husband at [facility] bedside [facility] CM called Jermaine with [facility] who accepted referral. Patient declined RW or any other HH Services. [facility] and patient planning to go home today with husband who will be with her for the first week post discharge home.   Expected Discharge Date:  04/10/16               Expected Discharge Plan:  Home w Home Health Services  In-House Referral:     Discharge planning Services  CM Consult  Post Acute Care Choice:  Home Health Choice offered to:  Patient, Spouse  DME Arranged:    DME Agency:     HH Arranged:  PT HH Agency:  [facility]  Status of Service:  Completed, signed off  If discussed at Long Length of Stay Meetings, dates discussed:    Additional Comments:  Darcel SmallingAnna C Abbegail Matuska, RN 04/10/2016, 10:40 AM

## 2016-04-10 NOTE — Progress Notes (Addendum)
  [facility] Progress Note  Subjective  - POD #6  Ready to go home. Denies nausea. Mild abdominal pain. Had 3 small BMs yesterday.   Objective Vitals:   04/09/16 1956 04/10/16 0555  BP: (!) 91/34 (!) 110/36  Pulse: 92 (!) 108  Resp: 18 18  Temp: 98.2 F (36.8 C) 98.8 F (37.1 C)   No intake or output data in the 24 hours ending 04/10/16 0800  Abdomen soft and nontender.  Incisions healing well Feet warm bilaterally   Assessment/Planning: 70 y.o. female is s/p: aortobifemoral bypass 6 Days Post-Op   Tolerating regular diet. Had BMs. Ambulating well.  D/c home today.   Raymond GurneyKimberly A Trinh 04/10/2016 8:00 AM -- Agree with above D/c home  Fabienne Brunsharles Fields, MD [facility] [place] Office: [phone] Pager: [phone]  Laboratory CBC    Component Value Date/Time   WBC 4.9 04/10/2016 0253   HGB 9.5 (L) 04/10/2016 0253   HCT 27.8 (L) 04/10/2016 0253   PLT 182 04/10/2016 0253    BMET    Component Value Date/Time   NA 138 04/10/2016 0253   K 4.0 04/10/2016 0253   CL 102 04/10/2016 0253   CO2 30 04/10/2016 0253   GLUCOSE 100 (H) 04/10/2016 0253   BUN 7 04/10/2016 0253   CREATININE 0.72 04/10/2016 0253   CALCIUM 8.8 (L) 04/10/2016 0253   GFRNONAA >60 04/10/2016 0253   GFRAA >60 04/10/2016 0253    COAG Lab Results  Component Value Date   INR 1.25 04/04/2016   INR 1.03 03/25/2016   No results found for: PTT  Antibiotics Anti-infectives    Start     Dose/Rate Route Frequency Ordered Stop   04/04/16 2200  vancomycin (VANCOCIN) IVPB 1000 mg/200 mL premix     1,000 mg 200 mL/hr over 60 Minutes Intravenous  Once 04/04/16 1738 04/04/16 2313   04/04/16 1730  vancomycin (VANCOCIN) IVPB 1000 mg/200 mL premix  Status:  Discontinued     1,000 mg 200 mL/hr over 60 Minutes Intravenous Every 12 hours 04/04/16 1721 04/04/16 1738   04/04/16 0600  vancomycin (VANCOCIN) IVPB 1000 mg/200 mL premix     1,000 mg 200 mL/hr  over 60 Minutes Intravenous 60 min pre-op 04/03/16 1358 04/04/16 0855       Maris BergerKimberly Trinh, PA-C [facility] Office: [phone] Pager: [phone] 04/10/2016 8:00 AM

## 2016-04-10 NOTE — Progress Notes (Signed)
Paged Sylvia Rodriguez about pt. Is c/o multiple issues.

## 2016-04-19 NOTE — Discharge Summary (Signed)
[facility] Discharge Summary  Sylvia Rodriguez 03/30/1946 70 y.o. female  [id number]  Admission Date: 04/04/2016  Discharge Date: 04/10/2016  Physician: Gretta Beganodd Early, MD  Admission Diagnosis: Peripheral vascular disease with bilateral lower extremity claudication I70.213  HPI:   This is a 70 y.o. female with history of bilateral claudication who underwent outpatient arteriogram on 02/01/2016. 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 She reports that she is severely limited by this. She is extremely active and is not able to tolerate this level, claudication. Explained she does not have any good endovascular treatment options for this. She did see Dr. Duke Salviaandolph for cardiac clearance and was felt to be an acceptable risk for aortic surgery.  Hospital Course:  The patient was admitted to the hospital and taken to the operating room on 04/04/2016 and underwent: Aortobifemoral bypass with a 14 x 8 Hemashield graft    The patient tolerated the procedure well and was transported to the PACU in stable condition.   POD 1: The patient had some nausea. She had low output from her NG tube and this was discontinued. She was breathing comfortable on 2L but her ABG had a pH of 7.28. This was rechecked later.  She was encouraged to use incentive spirometry and mobilize. She had acute blood loss anemia that was stable. Her feet were well perfused. Her repeat ABG showed a pH of 7.11, C02 58. HCO3 18.7. Dr. Arbie CookeyEarly consulted CCM. Pulmonary felt that she had mixed respiratory and metabolic acidosis and her poor respiratory effort due to abdominal pain. Bipap was ordered as needed. ABG was repeated in the afternoon. Her C02 was still elevated on repeat ABG and the patient was placed on bipap. By that evening, she was titrated to 3L [place].   POD 2: She was kept NPO  given no bowel function. Pulmonary toilet was continued.   POD 3: Started on clears and transferred to the floor.   POD 4: Passing flatus. Was advanced to regular diet.   The patient did well with a regular diet and had a BM by POD 6 she was ready for home.  Her pain was well controlled. Her incisions were healing well and feet well perfused. She was discharged home on POD 6 in good condition.   CBC    Component Value Date/Time   WBC 4.9 04/10/2016 0253   RBC 3.24 (L) 04/10/2016 0253   HGB 9.5 (L) 04/10/2016 0253   HCT 27.8 (L) 04/10/2016 0253   PLT 182 04/10/2016 0253   MCV 85.8 04/10/2016 0253   MCH 29.3 04/10/2016 0253   MCHC 34.2 04/10/2016 0253   RDW 14.3 04/10/2016 0253    BMET    Component Value Date/Time   NA 138 04/10/2016 0253   K 4.0 04/10/2016 0253   CL 102 04/10/2016 0253   CO2 30 04/10/2016 0253   GLUCOSE 100 (H) 04/10/2016 0253   BUN 7 04/10/2016 0253   CREATININE 0.72 04/10/2016 0253   CALCIUM 8.8 (L) 04/10/2016 0253   GFRNONAA >60 04/10/2016 0253   GFRAA >60 04/10/2016 0253     Discharge Instructions:   The patient is discharged to home with extensive instructions on wound care and progressive ambulation. They are instructed not to drive or perform any heavy lifting until returning to see the physician in his office.  Discharge Instructions    ABDOMINAL PROCEDURE/ANEURYSM REPAIR/AORTO-BIFEMORAL BYPASS:  Call MD for increased abdominal pain; cramping diarrhea; nausea/vomiting    Complete by:  As directed    Call MD for:  redness, tenderness, or signs of infection (pain, swelling, bleeding, redness, odor or green/yellow discharge around incision site)    Complete by:  As directed    Call MD for:  severe or increased pain, loss or decreased feeling  in affected limb(s)    Complete by:  As directed    Call MD for:  temperature >100.5    Complete by:  As directed    Discharge wound care:    Complete by:  As directed    Shower daily. Wash wounds gently  with soap and water and pat dry. Do not apply any creams or ointments to incisions. There is skin glue on your incisions. Do not peel this off. It will peel on its own.   Driving Restrictions    Complete by:  As directed    No driving for 2 weeks   Increase activity slowly    Complete by:  As directed    Walk with assistance use walker or cane as needed   Lifting restrictions    Complete by:  As directed    No heavy lifting for 2 weeks   Resume previous diet    Complete by:  As directed       Discharge Diagnosis:  Peripheral vascular disease with bilateral lower extremity claudication I70.213  Secondary Diagnosis: Patient Active Problem List   Diagnosis Date Noted  . Aortoiliac occlusive disease (HCC) 04/04/2016  . Essential hypertension 02/19/2016  . Hyperlipidemia 02/19/2016  . Peripheral arterial disease (HCC) 02/19/2016   Past Medical History:  Diagnosis Date  . Arthritis   . Complication of anesthesia    trouble waking up after colonoscopy  . Essential hypertension 02/19/2016  . History of pneumonia    "way back"  . Hyperlipidemia 02/19/2016  . Peripheral arterial disease (HCC) 02/19/2016       Medication List    STOP taking these medications   cilostazol 100 MG tablet Commonly known as:  PLETAL   pentoxifylline 400 MG CR tablet Commonly known as:  TRENTAL     TAKE these medications   amLODipine 5 MG tablet Commonly known as:  NORVASC Take 5 mg by mouth daily.   atorvastatin 10 MG tablet Commonly known as:  LIPITOR Take 10 mg by mouth at bedtime.   clopidogrel 75 MG tablet Commonly known as:  PLAVIX Take 75 mg by mouth daily.   diazepam 2 MG tablet Commonly known as:  VALIUM Take 2 mg by mouth 4 (four) times daily as needed (for spasms/cramping.).   diclofenac sodium 1 % Gel Commonly known as:  VOLTAREN Apply 2 g topically daily as needed (shoulder pain).   lisinopril 5 MG tablet Commonly known as:  PRINIVIL,ZESTRIL Take 5 mg by mouth  daily.   omeprazole 40 MG capsule Commonly known as:  PRILOSEC Take 40 mg by mouth daily.   TH COMPLETE MULTI PO Take 30 mLs by mouth daily.   traMADol 50 MG tablet Commonly known as:  ULTRAM Take 1 tablet (50 mg total) by mouth every 6 (six) hours as needed (for pain.).       Tramadol #15 No Refill  Disposition: Home  Patient's condition: is Good  Follow up: 1. Dr. Arbie CookeyEarly in 2 weeks   Maris BergerKimberly Pellegrino Kennard, PA-C [facility] [phone] 04/19/2016  10:22 AM   - For VQI Registry use ---  Post-op:  Time to Extubation: [ x] In OR, [ ]  < 12 hrs, [ ]  12-24 hrs, [ ]  >=24 hrs Vasopressors Req. Post-op: No ICU Stay: 2 days Transfusion: No   MI: No, [ ]  Troponin only, [ ]  EKG or Clinical New Arrhythmia: No  Complications: CHF: No Resp failure: No, [ ]  Pneumonia, [ ]  Ventilator Chg in renal function: No, [ ]  Inc. Cr > 0.5, [ ]  Temp. Dialysis, [ ]  Permanent dialysis Leg ischemia: No, no Surgery needed, [ ]  Yes, Surgery needed, [ ]  Amputation Bowel ischemia: No, [ ]  Medical Rx, [ ]  Surgical Rx Wound complication: No, [ ]  Superficial separation/infection, [ ]  Return to OR Return to OR: No  Return to OR for bleeding: No Stroke: No, [ ]  Minor, [ ]  Major  Discharge medications: Statin use:  Yes If No: [ ]  For Medical reasons, [ ]  Non-compliant ASA use:  No  If No: [x ] For Medical reasons, [ ]  Non-compliant Plavix use:  Yes If No: [ ]  For Medical reasons, [ ]  Non-compliant Beta blocker use:  No If No: [x ] For Medical reasons, on CCB and ACEI [ ]  Non-compliant

## 2016-04-20 ENCOUNTER — Telehealth: Payer: Self-pay | Admitting: *Deleted

## 2016-04-20 ENCOUNTER — Telehealth: Payer: Self-pay

## 2016-04-20 ENCOUNTER — Encounter: Payer: Self-pay | Admitting: Vascular Surgery

## 2016-04-20 NOTE — Telephone Encounter (Signed)
Returned call to patient.  She states that since her surgery on 04/04/16 she has had diarrhea. She has tried soups and various other foods.  Patient denies fever or blood  in her stool. Patient also states that her blood pressure was low today "90" she could not remember the diastolic and that she stumbled and felt weak yesterday. Today, however, she has been out for the first time with a friend.  She states that she is seeing her PCP tomorrow, 04/20/16. I suggested that if her weakness continues she should go to the ED also to have ice chips as tolerated.  Patient voiced understanding of the instructions.

## 2016-04-20 NOTE — Telephone Encounter (Signed)
Rec'd vm this morning from pt. reporting freq. And persistent diarrhea.  Stated "I eat and the food just runs right through me."  Attempted to call pt.  Left vm. To return call to the office to discuss her symptoms.

## 2016-04-26 ENCOUNTER — Encounter: Payer: Self-pay | Admitting: Vascular Surgery

## 2016-04-26 ENCOUNTER — Ambulatory Visit (INDEPENDENT_AMBULATORY_CARE_PROVIDER_SITE_OTHER): Payer: Self-pay | Admitting: Vascular Surgery

## 2016-04-26 VITALS — BP 123/61 | HR 91 | Temp 97.4°F | Resp 16 | Wt 107.0 lb

## 2016-04-26 DIAGNOSIS — Z95828 Presence of other vascular implants and grafts: Secondary | ICD-10-CM

## 2016-04-26 NOTE — Progress Notes (Signed)
POST OPERATIVE OFFICE NOTE    CC:  F/u for surgery  HPI:  This is a 70 y.o. female who is s/p aortobifemoral bypass grafting with 14 x 8 Hemashield graft by Dr. Arbie CookeyEarly on 04/04/16.  She states that she has lost 10lbs, she has no appetite or energy.  She states that she has not really felt like cooking, but when she has, she has been eating healthy.  She states that she really hasn't walked far enough to determine a difference in her legs/feet.  Her bowels aren't working normally b/c she is not eating normally.  She states that she has had low blood pressure on her medications and it is making her woozy and dizzy.  She has stopped all medications at this point.  She returns to Dr. Jacqlyn LarsenBulla Friday for follow up.    Allergies  Allergen Reactions  . Penicillins Hives     Has patient had a PCN reaction causing immediate rash, facial/tongue/throat swelling, SOB or lightheadedness with hypotension:  # # YES # #  Has patient had a PCN reaction causing severe rash involving mucus membranes or skin necrosis: No Has patient had a PCN reaction that required hospitalization No Has patient had a PCN reaction occurring within the last 10 years: No If all of the above answers are "NO", then may proceed with Cephalosporin use.    . Aspirin Nausea And Vomiting  . Codeine Other (See Comments)    Bone Pain    Current Outpatient Prescriptions  Medication Sig Dispense Refill  . amLODipine (NORVASC) 5 MG tablet Take 5 mg by mouth daily.  5  . atorvastatin (LIPITOR) 10 MG tablet Take 10 mg by mouth at bedtime.     . clopidogrel (PLAVIX) 75 MG tablet Take 75 mg by mouth daily.    . diazepam (VALIUM) 2 MG tablet Take 2 mg by mouth 4 (four) times daily as needed (for spasms/cramping.).     Marland Kitchen. diclofenac sodium (VOLTAREN) 1 % GEL Apply 2 g topically daily as needed (shoulder pain).     Marland Kitchen. lisinopril (PRINIVIL,ZESTRIL) 5 MG tablet Take 5 mg by mouth daily.  2  . Multiple Vitamins-Minerals (TH COMPLETE MULTI PO) Take  30 mLs by mouth daily.     Marland Kitchen. omeprazole (PRILOSEC) 40 MG capsule Take 40 mg by mouth daily.    . traMADol (ULTRAM) 50 MG tablet Take 1 tablet (50 mg total) by mouth every 6 (six) hours as needed (for pain.). 15 tablet 0   No current facility-administered medications for this visit.      ROS:  See HPI  Physical Exam:  Vitals:   04/26/16 1342  BP: 123/61  Pulse: 91  Resp: 16  Temp: 97.4 F (36.3 C)    Incision:  Midline and bilateral groin incisions are clean and dry; healing very nicely Extremities:  Brisk doppler signals bilateral feet; bilateral feet are warm and well perfused. Neuro: in tact Abdomen:  Soft, NT/ND   Assessment/Plan:  This is a 70 y.o. female who is s/p: Aortobifemoral bypass grafting on 04/04/16 by Dr. Arbie CookeyEarly  -pt is doing okay.  She has malaise and fatigue s/p surgery, which is expected.  She continues to have a decreased appetite, which is also expected.  We have discussed with her that during this post operative time, she should eat anything that she has a taste for, which will help her heal and get her energy back.  She will continue to increase her walking each day. -she will f/u  in  3 months with Dr. Arbie CookeyEarly with [facility].  She will call sooner if needed. -she has an appointment with Dr. Jacqlyn LarsenBulla on Friday at which time, her medications can be reviewed as she was having dizziness with low blood pressure.  (she is off all medications at this time).      Doreatha MassedSamantha Dayton Kenley, PA-C [facility] [phone]  Clinic MD:  Pt seen and examined with Dr. Arbie CookeyEarly  I have examined the patient, reviewed and agree with above.Recovering from her surgery. Will slowly increase her activity. We'll see her again in 3 months for continued follow-up  Gretta BeganEarly, Todd, MD 04/26/2016 3:02 PM

## 2016-04-27 ENCOUNTER — Telehealth: Payer: Self-pay | Admitting: *Deleted

## 2016-04-27 NOTE — Telephone Encounter (Signed)
Melanie, Physical Therapist with [facility] called requesting an order to continue physical therapy twice weekly for 3 more weeks.  I spoke with patient and she feels that she is in need of continued therapy.  I called Shawna OrleansMelanie and gave the verbal to continue care. The patient and therapist voiced understanding of the instructions.

## 2016-05-03 ENCOUNTER — Encounter: Payer: Self-pay | Admitting: Physician Assistant

## 2016-05-03 ENCOUNTER — Ambulatory Visit (INDEPENDENT_AMBULATORY_CARE_PROVIDER_SITE_OTHER): Payer: BLUE CROSS/BLUE SHIELD | Admitting: Physician Assistant

## 2016-05-03 VITALS — BP 112/50 | HR 78 | Ht 61.0 in | Wt 114.4 lb

## 2016-05-03 DIAGNOSIS — I739 Peripheral vascular disease, unspecified: Secondary | ICD-10-CM | POA: Diagnosis not present

## 2016-05-03 DIAGNOSIS — I70213 Atherosclerosis of native arteries of extremities with intermittent claudication, bilateral legs: Secondary | ICD-10-CM

## 2016-05-03 DIAGNOSIS — I10 Essential (primary) hypertension: Secondary | ICD-10-CM

## 2016-05-03 DIAGNOSIS — E785 Hyperlipidemia, unspecified: Secondary | ICD-10-CM

## 2016-05-03 NOTE — Progress Notes (Signed)
Cardiology Office Note   Date:  05/03/2016   ID:  Sylvia Rodriguez, DOB 12-31-1945, MRN [id number]  PCP:  Doreen Salvageonald Bulla, PA-C  Cardiologist:  Dr Duke Salviaandolph 02/19/2016  Theodore DemarkBarrett, Marqueze Ramcharan, PA-C   Chief Complaint  Patient presents with  . Follow-up  . Edema    in abdominal    History of Present Illness: Sylvia Rodriguez is a 70 y.o. female with a history of HTN, HLD, PAD, mild cerebrovascular dz by Doppler.  D/c 11/26 After AoBifem 12/12, seen by [facility], pt had stopped all BP meds due to low BP, also w/ poor appetite  Sylvia Rodriguez presents for post-hospital follow up.  She is eating better and has gained some of the weight back. She is going back to eating very healthy. She eats many vegetables, including kale. She is eating low fat and sugar free. She was a size 8, but does not want to get back that big. She is currently a size 4.   She feels her abdomen is a little swollen, she is a little constipated and agrees she may be a little bloated.   She is working with P.T. 2 x week and starting to walk longer distances. She still gets tired doing housework but is able to pace herself and get things done.   She drinks mostly water, rarely uses butter, uses olive oil. Uses some salt, not much.   No LE edema, no orthopnea or PND. DOE is improving.   Past Medical History:  Diagnosis Date  . Arthritis   . Complication of anesthesia    trouble waking up after colonoscopy  . Essential hypertension 02/19/2016  . History of pneumonia    "way back"  . Hyperlipidemia 02/19/2016  . Peripheral arterial disease (HCC) 02/19/2016    Past Surgical History:  Procedure Laterality Date  . ANKLE SURGERY     broke ankle in 3 places  . AORTA - BILATERAL FEMORAL ARTERY BYPASS GRAFT Bilateral 04/04/2016   Procedure: AORTOBIFEMORAL BYPASS GRAFT;  Surgeon: Larina Earthlyodd F Early, MD;  [facility];  Service: Vascular;  Laterality: Bilateral;  . APPENDECTOMY    . CATARACT EXTRACTION W/ INTRAOCULAR  LENS IMPLANT Bilateral   . COLONOSCOPY    . CYST REMOVAL HAND    . OOPHORECTOMY    . PERIPHERAL VASCULAR CATHETERIZATION N/A 01/27/2016   Procedure: Abdominal Aortogram w/Lower Extremity;  Surgeon: Maeola HarmanBrandon Christopher Cain, MD;  [facility];  Service: Cardiovascular;  Laterality: N/A;  . TONSILLECTOMY      Current Outpatient Prescriptions  Medication Sig Dispense Refill  . atorvastatin (LIPITOR) 10 MG tablet Take 10 mg by mouth at bedtime.     . clopidogrel (PLAVIX) 75 MG tablet Take 75 mg by mouth daily.    Marland Kitchen. omeprazole (PRILOSEC) 40 MG capsule Take 40 mg by mouth daily.     No current facility-administered medications for this visit.     Allergies:   Penicillins; Aspirin; and Codeine    Social History:  The patient  reports that she quit smoking about 5 years ago. She has never used smokeless tobacco. She reports that she does not drink alcohol or use drugs.   Family History:  The patient's family history includes Diabetes in her mother; Heart attack in her maternal uncle and mother; Stroke in her maternal aunt; Valvular heart disease in her mother.    ROS:  Please see the history of present illness. All other systems are reviewed and negative.    PHYSICAL  EXAM: VS:  BP (!) 112/50   Pulse 78   Ht  (1.549 m)   Wt 114 lb 6.4 oz (51.9 kg)   BMI 21.62 kg/m  , BMI Body mass index is 21.62 kg/m. GEN: Well nourished, well developed, female in no acute distress  HEENT: normal for age  Neck: no JVD, R>L carotid bruit, no masses Cardiac: RRR; no murmur, no rubs, or gallops Respiratory:  clear to auscultation bilaterally, normal work of breathing GI: soft, +tender, nondistended, + BS;  MS: no deformity or atrophy; no edema; distal pulses are 1-2+ in all 4 extremities   Skin: warm and dry, no rash Neuro:  Strength and sensation are intact Psych: euthymic mood, full affect   EKG:  EKG is not ordered today. The ekg ordered today demonstrates    Recent  Labs: 04/05/2016: ALT 14; Magnesium 1.4 04/10/2016: BUN 7; Creatinine, Ser 0.72; Hemoglobin 9.5; Platelets 182; Potassium 4.0; Sodium 138    Lipid Panel No results found for: CHOL, TRIG, HDL, CHOLHDL, VLDL, LDLCALC, LDLDIRECT   Wt Readings from Last 3 Encounters:  05/03/16 114 lb 6.4 oz (51.9 kg)  04/26/16 107 lb (48.5 kg)  04/07/16 121 lb 1.6 oz (54.9 kg)     Other studies Reviewed: Additional studies/ records that were reviewed today include: office notes, hospital records.  ASSESSMENT AND PLAN:  1.  PAD- management per [facility], pt is recovering well from surgery.  2. HTN: BP has normalized after surgery, with weight loss. She is not currently taking any medications for BP control, will not restart her amlodipine 5 mg or lisinopril 5 mg for now. She is to continue to follow her BP, let us know if it starts running higher.  3. Hyperlipidemia: Goal LDL < 70 2nd vascular disease. She is eating very healthy, continue Lipitor, f/u with PCP and Dr Duke Salvia.  Current medicines are reviewed at length with the patient today.  The patient does not have concerns regarding medicines.  The following changes have been made:  no change  Labs/ tests ordered today include:  No orders of the defined types were placed in this encounter.    Disposition:   FU with Dr Duke Salvia.  Tawny Asal  05/03/2016 10:57 AM    [facility] Phone: [phone]; Fax: [phone]  This note was written with the assistance of speech recognition software. Please excuse any transcriptional errors.

## 2016-05-03 NOTE — Patient Instructions (Addendum)
Medication Instructions:  Your physician recommends that you continue on your current medications as directed. Please refer to the Current Medication list given to you today.  Labwork: NONE  Testing/Procedures: NONE  Follow-Up: Your physician wants you to follow-up as already scheduled with Dr. Duke Salviaandolph in February.   If you need a refill on your cardiac medications before your next appointment, please call your pharmacy.

## 2016-06-14 NOTE — Progress Notes (Signed)
Cardiology Office Note   Date:  06/17/2016   ID:  Sylvia, Rodriguez 14-Feb-1946, MRN [id number]  PCP:  Doreen Salvage, PA-C  Cardiologist:   Chilton Si, MD   Chief Complaint  Patient presents with  . Follow-up     3 months;  . Leg Pain    cramping in legs.      History of Present Illness: Sylvia Rodriguez is a 71 y.o. female with hypertension, hyperlipidemia, and PAD who presents for follow up.  Sylvia Rodriguez was first seen 02/2016 for presurgical risk assessment prior to aortobifemoral bypass.  At the time she denied exertional dyspnea or chest pain. She was felt to be at acceptable risk to pursue surgery. She underwent aortobifemoral bypass on 04/04/16. She did endorse dizziness and her blood pressure was consistently in the 90s over 40s. Therefore her amlodipine and lisinopril were discontinued. She was noted to have a right carotid bruit.  Carotid Dopplers 02/25/16 showed 1-39% ICA stenosis bilaterally.  This was complicated by respiratory distress requiring BiPAP.  Since her hospitalization she has been doing well.  She denies any chest pain or shortness of breath. Her only complaint is pin in her R Achilles.  She wonders if it is related to her prior broken ankle that required surgery.  She has no calf pain or shortness of breath.  She denies lower extremity edema, orthopnea, or PND.  She reports that her PCP recently checked her lipids. She was previously seen by Dr. Hanley Hays in [facility] but prefers to follow-up here in the future.  Past Medical History:  Diagnosis Date  . Arthritis   . Carotid stenosis 06/17/2016   1-39% 02/2016  . Complication of anesthesia    trouble waking up after colonoscopy  . Essential hypertension 02/19/2016  . History of pneumonia    "way back"  . Hyperlipidemia 02/19/2016  . Peripheral arterial disease (HCC) 02/19/2016    Past Surgical History:  Procedure Laterality Date  . ANKLE SURGERY     broke ankle in 3 places  . AORTA - BILATERAL  FEMORAL ARTERY BYPASS GRAFT Bilateral 04/04/2016   Procedure: AORTOBIFEMORAL BYPASS GRAFT;  Surgeon: Larina Earthly, MD;  Location: [facility];  Service: Vascular;  Laterality: Bilateral;  . APPENDECTOMY    . CATARACT EXTRACTION W/ INTRAOCULAR LENS IMPLANT Bilateral   . COLONOSCOPY    . CYST REMOVAL HAND    . OOPHORECTOMY    . PERIPHERAL VASCULAR CATHETERIZATION N/A 01/27/2016   Procedure: Abdominal Aortogram w/Lower Extremity;  Surgeon: Maeola Harman, MD;  Location: [facility] INVASIVE CV LAB;  Service: Cardiovascular;  Laterality: N/A;  . TONSILLECTOMY       Current Outpatient Prescriptions  Medication Sig Dispense Refill  . atorvastatin (LIPITOR) 10 MG tablet Take 10 mg by mouth at bedtime.     . clopidogrel (PLAVIX) 75 MG tablet Take 75 mg by mouth daily.    Marland Kitchen omeprazole (PRILOSEC) 40 MG capsule Take 40 mg by mouth daily.     No current facility-administered medications for this visit.     Allergies:   Penicillins; Aspirin; and Codeine    Social History:  The patient  reports that she quit smoking about 5 years ago. She has never used smokeless tobacco. She reports that she does not drink alcohol or use drugs.   Family History:  The patient's family history includes Diabetes in her mother; Heart attack in her maternal uncle and mother; Stroke in her maternal aunt; Valvular heart disease in  her mother.    ROS:  Please see the history of present illness.   Otherwise, review of systems are positive for none.   All other systems are reviewed and negative.    PHYSICAL EXAM: VS:  BP (!) 123/56   Pulse 87   Ht 5\' 2"  (1.575 m)   Wt 54.3 kg (119 lb 12.8 oz)   BMI 21.91 kg/m  , BMI Body mass index is 21.91 kg/m. GENERAL:  Well appearing HEENT:  Pupils equal round and reactive, fundi not visualized, oral mucosa unremarkable NECK:  No jugular venous distention, waveform within normal limits, carotid upstroke brisk and symmetric, R carotid bruit, no thyromegaly LYMPHATICS:  No cervical  adenopathy LUNGS:  Clear to auscultation bilaterally HEART:  RRR.  PMI not displaced or sustained,S1 and S2 within normal limits, no S3, no S4, no clicks, no rubs, no murmurs ABD:  Flat, positive bowel sounds normal in frequency in pitch, no bruits, no rebound, no guarding, no midline pulsatile mass, no hepatomegaly, no splenomegaly EXT:  2 plus pulses throughout, no edema, no cyanosis no clubbing SKIN:  No rashes no nodules NEURO:  Cranial nerves II through XII grossly intact, motor grossly intact throughout PSYCH:  Cognitively intact, oriented to person place and time   EKG:  EKG is not ordered today. The ekg ordered 01/27/16 demonstrates sinus rhythm rate 73 bpm.   Carotid Doppler 02/25/16:1-39% ICA stenosis bilaterally.  Recent Labs: 04/05/2016: ALT 14; Magnesium 1.4 04/10/2016: BUN 7; Creatinine, Ser 0.72; Hemoglobin 9.5; Platelets 182; Potassium 4.0; Sodium 138    Lipid Panel No results found for: CHOL, TRIG, HDL, CHOLHDL, VLDL, LDLCALC, LDLDIRECT    Wt Readings from Last 3 Encounters:  06/15/16 54.3 kg (119 lb 12.8 oz)  05/03/16 51.9 kg (114 lb 6.4 oz)  04/26/16 48.5 kg (107 lb)      ASSESSMENT AND PLAN:    # Hyperlipidemia: Her PCP reduced the atorvastatin which helped her myalgias. She is scheduled to get labs drawn this week.  If her lipids are above goal we will refer her to lipid clinic for consideration of a PCSK9 inhibitor.  Goal LDL should be <70 due to PAD.  # Hypertension: Sylvia Rodriguez complains of dizziness and her blood pressure is consistently in the 90s/40s.  We will hold lisinopril and amlodipine.  She will check her BP regularly and call if it is >140/90.   # Carotid stenosis:  1-39% stenosis bilaterally.  Repeat carotid Dopplers in 1 years.  Continue atorvastatin and Plavix.     Sylvia Rodriguez was noted to have a bruit in her right carotid. We will refer her for carotid Dopplers.  Current medicines are reviewed at length with the patient today.  The  patient does not have concerns regarding medicines.  The following changes have been made:  Stop amlodipine and lisinopril.  Labs/ tests ordered today include:  No orders of the defined types were placed in this encounter.    Disposition:   FU with Antawn Sison C. Duke Salviaandolph, MD, [facility] in 1 year   This note was written with the assistance of speech recognition software.  Please excuse any transcriptional errors.  Signed, Shadee Rathod C. Duke Salviaandolph, MD, [facility]  06/17/2016 8:21 AM    [facility]

## 2016-06-15 ENCOUNTER — Encounter: Payer: Self-pay | Admitting: Cardiovascular Disease

## 2016-06-15 ENCOUNTER — Ambulatory Visit (INDEPENDENT_AMBULATORY_CARE_PROVIDER_SITE_OTHER): Payer: BLUE CROSS/BLUE SHIELD | Admitting: Cardiovascular Disease

## 2016-06-15 VITALS — BP 123/56 | HR 87 | Ht 62.0 in | Wt 119.8 lb

## 2016-06-15 DIAGNOSIS — I739 Peripheral vascular disease, unspecified: Secondary | ICD-10-CM

## 2016-06-15 DIAGNOSIS — I10 Essential (primary) hypertension: Secondary | ICD-10-CM

## 2016-06-15 DIAGNOSIS — I6523 Occlusion and stenosis of bilateral carotid arteries: Secondary | ICD-10-CM

## 2016-06-15 DIAGNOSIS — E7800 Pure hypercholesterolemia, unspecified: Secondary | ICD-10-CM

## 2016-06-15 NOTE — Patient Instructions (Signed)
Medication Instructions:  Your physician recommends that you continue on your current medications as directed. Please refer to the Current Medication list given to you today.  Labwork: NONE  Testing/Procedures: Your physician has requested that you have a carotid duplex. This test is an ultrasound of the carotid arteries in your neck. It looks at blood flow through these arteries that supply the brain with blood. Allow one hour for this exam. There are no restrictions or special instructions. IN 1 YEAR PRIOR TO OV  Follow-Up: Your physician wants you to follow-up in: 1 YEAR OV AFTER DOPPLER  You will receive a reminder letter in the mail two months in advance. If you don't receive a letter, please call our office to schedule the follow-up appointment.  If you need a refill on your cardiac medications before your next appointment, please call your pharmacy.

## 2016-06-17 ENCOUNTER — Encounter: Payer: Self-pay | Admitting: Cardiovascular Disease

## 2016-06-17 DIAGNOSIS — I6529 Occlusion and stenosis of unspecified carotid artery: Secondary | ICD-10-CM

## 2016-06-17 HISTORY — DX: Occlusion and stenosis of unspecified carotid artery: I65.29

## 2016-06-21 ENCOUNTER — Ambulatory Visit: Payer: BLUE CROSS/BLUE SHIELD | Admitting: Cardiovascular Disease

## 2016-07-15 ENCOUNTER — Encounter: Payer: Self-pay | Admitting: Vascular Surgery

## 2016-07-18 NOTE — Addendum Note (Signed)
Addended by: Burton ApleyPETTY, Gregroy Dombkowski A on: 07/18/2016 04:55 PM   Modules accepted: Orders

## 2016-07-26 ENCOUNTER — Ambulatory Visit (INDEPENDENT_AMBULATORY_CARE_PROVIDER_SITE_OTHER): Payer: BLUE CROSS/BLUE SHIELD | Admitting: Vascular Surgery

## 2016-07-26 ENCOUNTER — Encounter: Payer: Self-pay | Admitting: Vascular Surgery

## 2016-07-26 ENCOUNTER — Ambulatory Visit (HOSPITAL_COMMUNITY)
Admission: RE | Admit: 2016-07-26 | Discharge: 2016-07-26 | Disposition: A | Discharge status: Home / Self Care | Payer: BLUE CROSS/BLUE SHIELD | Source: Ambulatory Visit | Attending: Vascular Surgery | Admitting: Vascular Surgery

## 2016-07-26 VITALS — BP 109/57 | HR 86 | Temp 98.0°F | Resp 16 | Ht 62.0 in | Wt 126.0 lb

## 2016-07-26 DIAGNOSIS — I6523 Occlusion and stenosis of bilateral carotid arteries: Secondary | ICD-10-CM

## 2016-07-26 DIAGNOSIS — Z95828 Presence of other vascular implants and grafts: Secondary | ICD-10-CM

## 2016-07-26 DIAGNOSIS — Z4889 Encounter for other specified surgical aftercare: Secondary | ICD-10-CM | POA: Diagnosis present

## 2016-07-26 NOTE — Progress Notes (Signed)
[facility] [place]  Patient name: Sylvia Rodriguez MRN: [id number] DOB: [birth year] Sex: female  REASON FOR VISIT: Follow-up aortofemoral bypass grafting on 04/04/2016  HPI: Sylvia Rodriguez is a 71 y.o. female here today for follow-up of aortobifemoral bypass for severe aortoiliac occlusive disease. She looks quite good today. She is very active in her occupation and has been very health conscious since her surgery. She has been very compliant in her diet. She denies any claudication symptoms. She does report some discomfort on the dorsum of her foot when she wore high heels and this appears to be more arthritic than any evidence of ischemia. She's had no evidence of ventral incisional hernia.  Past Medical History:  Diagnosis Date  . Arthritis   . Carotid stenosis 06/17/2016   1-39% 02/2016  . Complication of anesthesia    trouble waking up after colonoscopy  . Essential hypertension 02/19/2016  . History of pneumonia    "way back"  . Hyperlipidemia 02/19/2016  . Peripheral arterial disease (HCC) 02/19/2016    Family History  Problem Relation Age of Onset  . Diabetes Mother   . Heart attack Mother   . Valvular heart disease Mother   . Stroke Maternal Aunt   . Heart attack Maternal Uncle     SOCIAL HISTORY: Social History  Substance Use Topics  . Smoking status: Former Smoker    Quit date: 01/19/2011  . Smokeless tobacco: Never Used  . Alcohol use No    Allergies  Allergen Reactions  . Penicillins Hives     Has patient had a PCN reaction causing immediate rash, facial/tongue/throat swelling, SOB or lightheadedness with hypotension:  # # YES # #  Has patient had a PCN reaction causing severe rash involving mucus membranes or skin necrosis: No Has patient had a PCN reaction that required hospitalization No Has patient had a PCN reaction occurring within the last 10 years: No If all of the above answers are "NO",  then may proceed with Cephalosporin use.    . Aspirin Nausea And Vomiting  . Codeine Other (See Comments)    Bone Pain    Current Outpatient Prescriptions  Medication Sig Dispense Refill  . atorvastatin (LIPITOR) 10 MG tablet Take 10 mg by mouth at bedtime.     . clopidogrel (PLAVIX) 75 MG tablet Take 75 mg by mouth daily.    Marland Kitchen. omeprazole (PRILOSEC) 40 MG capsule Take 40 mg by mouth daily.     No current facility-administered medications for this visit.     REVIEW OF SYSTEMS:  [X]  denotes positive finding, [ ]  denotes negative finding Cardiac  Comments:  Chest pain or chest pressure:    Shortness of breath upon exertion:    Short of breath when lying flat:    Irregular heart rhythm:        Vascular    Pain in calf, thigh, or hip brought on by ambulation:    Pain in feet at night that wakes you up from your sleep:     Blood clot in your veins:    Leg swelling:           PHYSICAL EXAM: Vitals:   07/26/16 1320  BP: (!) 109/57  Pulse: 86  Resp: 16  Temp: 98 F (36.7 C)  SpO2: 100%  Weight: 126 lb (57.2 kg)  Height: 5\' 2"  (1.575 m)    GENERAL: The patient is a well-nourished female, in no acute distress. The vital signs are  documented above. CARDIOVASCULAR: 2+ radial 2+ femoral and 2+ dorsalis pedis pulses bilaterally. Abdomen soft and nontender with well-healed midline and no evidence of hernia PULMONARY: There is good air exchange  MUSCULOSKELETAL: There are no major deformities or cyanosis. NEUROLOGIC: No focal weakness or paresthesias are detected. SKIN: There are no ulcers or rashes noted. PSYCHIATRIC: The patient has a normal affect.  DATA:  Noninvasive studies today reveal ankle arm index normal bilaterally at 1.1 with biphasic waveforms bilaterally.  MEDICAL ISSUES: Excellent recovery following aortobifemoral bypass. She is released to return to full activity without limitation. We will see her again in 9 months for one-year follow-up. We'll repeat ankle  arm indices that time. Assuming these are normal and she is asymptomatic we'll discontinue ongoing follow-up    Larina Earthly, MD FACS [facility] Tel [phone] Pager [phone]

## 2016-07-26 NOTE — Addendum Note (Signed)
Addended by: Burton ApleyPETTY, Shiva Karis A on: 07/26/2016 05:04 PM   Modules accepted: Orders

## 2016-10-03 ENCOUNTER — Telehealth: Payer: Self-pay | Admitting: *Deleted

## 2016-10-03 DIAGNOSIS — M79605 Pain in left leg: Principal | ICD-10-CM

## 2016-10-03 DIAGNOSIS — Z95828 Presence of other vascular implants and grafts: Secondary | ICD-10-CM

## 2016-10-03 DIAGNOSIS — M79604 Pain in right leg: Secondary | ICD-10-CM

## 2016-10-03 NOTE — Telephone Encounter (Addendum)
Ms Sylvia Rodriguez called to report that for the past 2 weeks she has had increasing pain in her feet and lower legs. She has had swelling in both feet and ankles; no falls or injuries reported. She is afebrile and reports some pain at night when she is sleeping. There are no wounds or ulcers visible but patient describes increased pain in her right great toe. Patient is S/P Aortobifemoral BP on 04-04-16 by Dr. Arbie CookeyEarly. Since she is having this symptoms, I will set her up to see our NP with ABIs asap.

## 2016-11-14 ENCOUNTER — Ambulatory Visit: Payer: BLUE CROSS/BLUE SHIELD | Admitting: Cardiovascular Disease

## 2016-12-14 ENCOUNTER — Ambulatory Visit (INDEPENDENT_AMBULATORY_CARE_PROVIDER_SITE_OTHER): Payer: BLUE CROSS/BLUE SHIELD | Admitting: Cardiovascular Disease

## 2016-12-14 ENCOUNTER — Encounter: Payer: Self-pay | Admitting: Cardiovascular Disease

## 2016-12-14 VITALS — BP 116/64 | HR 99 | Ht 62.0 in | Wt 131.0 lb

## 2016-12-14 DIAGNOSIS — I6523 Occlusion and stenosis of bilateral carotid arteries: Secondary | ICD-10-CM | POA: Diagnosis not present

## 2016-12-14 DIAGNOSIS — I10 Essential (primary) hypertension: Secondary | ICD-10-CM

## 2016-12-14 DIAGNOSIS — E7800 Pure hypercholesterolemia, unspecified: Secondary | ICD-10-CM

## 2016-12-14 DIAGNOSIS — Z95828 Presence of other vascular implants and grafts: Secondary | ICD-10-CM

## 2016-12-14 NOTE — Progress Notes (Signed)
Cardiology Office Note   Date:  12/14/2016   ID:  Sylvia Rodriguez, DOB 16-Nov-1945, MRN [id number]  PCP:  Doreen SalvageBulla, Donald, PA-C  Cardiologist:   Chilton Siiffany [place], MD   Chief Complaint  Patient presents with  . Follow-up  . Edema     History of Present Illness: Sylvia Rodriguez is a 71 y.o. female with hypertension, hyperlipidemia, and PAD who presents for follow up.  Sylvia Rodriguez was first seen 02/2016 for presurgical risk assessment prior to aortobifemoral bypass.  At the time she denied exertional dyspnea or chest pain. She was felt to be at acceptable risk to pursue surgery. She underwent aortobifemoral bypass on 04/04/16. She did report dizziness and her blood pressure was consistently in the 90s over 40s. Therefore her amlodipine and lisinopril were discontinued. She was noted to have a right carotid bruit.  Carotid Dopplers 02/25/16 showed 1-39% ICA stenosis bilaterally.    Sylvia Rodriguez as been feeling well. She continues to be very physically active.  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  She hasn't noted any chest pain or shortness of breath.  She has noted some mild edema in her L foot.  This all was improves with elevation of her legs. She notes that it is worse when she has salty meals or multiple caffeinated beverages in one day. She denies orthopnea or PND.  She reports that her lipids were checked with her PCP recently.  Past Medical History:  Diagnosis Date  . Arthritis   . Carotid stenosis 06/17/2016   1-39% 02/2016  . Complication of anesthesia    trouble waking up after colonoscopy  . Essential hypertension 02/19/2016  . History of pneumonia    "way back"  . Hyperlipidemia 02/19/2016  . Peripheral arterial disease (HCC) 02/19/2016    Past Surgical History:  Procedure Laterality Date  . ANKLE SURGERY     broke ankle in 3 places  . AORTA - BILATERAL FEMORAL ARTERY BYPASS GRAFT Bilateral 04/04/2016   Procedure: AORTOBIFEMORAL BYPASS GRAFT;   Surgeon: Larina Earthlyodd F Early, MD;  [facility];  Service: Vascular;  Laterality: Bilateral;  . APPENDECTOMY    . CATARACT EXTRACTION W/ INTRAOCULAR LENS IMPLANT Bilateral   . COLONOSCOPY    . CYST REMOVAL HAND    . OOPHORECTOMY    . PERIPHERAL VASCULAR CATHETERIZATION N/A 01/27/2016   Procedure: Abdominal Aortogram w/Lower Extremity;  Surgeon: Maeola HarmanBrandon Christopher Cain, MD;  [facility];  Service: Cardiovascular;  Laterality: N/A;  . TONSILLECTOMY       Current Outpatient Prescriptions  Medication Sig Dispense Refill  . atorvastatin (LIPITOR) 10 MG tablet Take 10 mg by mouth at bedtime.     . clopidogrel (PLAVIX) 75 MG tablet Take 75 mg by mouth daily.    Marland Kitchen. omeprazole (PRILOSEC) 40 MG capsule Take 40 mg by mouth daily.     No current facility-administered medications for this visit.     Allergies:   Penicillins; Aspirin; and Codeine    Social History:  The patient  reports that she quit smoking about 5 years ago. She has never used smokeless tobacco. She reports that she does not drink alcohol or use drugs.   Family History:  The patient's family history includes Diabetes in her mother; Heart attack in her maternal uncle and mother; Stroke in her maternal aunt; Valvular heart disease in her mother.    ROS:  Please see the history of present illness.   Otherwise, review  of systems are positive for none.   All other systems are reviewed and negative.    PHYSICAL EXAM: VS:  BP 116/64   Pulse 99   Ht 5\' 2"  (1.575 m)   Wt 59.4 kg (131 lb)   BMI 23.96 kg/m  , BMI Body mass index is 23.96 kg/m. GENERAL:  Well appearing.  No acute distress HEENT:  Pupils equal round and reactive, fundi not visualized, oral mucosa unremarkable NECK:  No jugular venous distention, waveform within normal limits, carotid upstroke brisk and symmetric, R carotid bruit LUNGS:  Clear to auscultation bilaterally.  No crackles, wheezes, or rhonchi. HEART:  RRR.  PMI not displaced or sustained,S1  and S2 within normal limits, no S3, no S4, no clicks, no rubs, no murmurs ABD:  Flat, positive bowel sounds normal in frequency in pitch, no bruits, no rebound, no guarding, no midline pulsatile mass, no hepatomegaly, no splenomegaly EXT:  2 plus pulses throughout, trace L pedal edema, no cyanosis no clubbing SKIN:  No rashes no nodules NEURO:  Cranial nerves II through XII grossly intact, motor grossly intact throughout PSYCH:  Cognitively intact, oriented to person place and time   EKG:  EKG is ordered today. The ekg ordered 01/27/16 demonstrates sinus rhythm rate 73 bpm.  12/14/16: Sinus rhythm. Rate 99 bpm. Extremity  Carotid Doppler 02/25/16:1-39% ICA stenosis bilaterally.  Recent Labs: 04/05/2016: ALT 14; Magnesium 1.4 04/10/2016: BUN 7; Creatinine, Ser 0.72; Hemoglobin 9.5; Platelets 182; Potassium 4.0; Sodium 138    Lipid Panel No results found for: CHOL, TRIG, HDL, CHOLHDL, VLDL, LDLCALC, LDLDIRECT    Wt Readings from Last 3 Encounters:  12/14/16 59.4 kg (131 lb)  07/26/16 57.2 kg (126 lb)  06/15/16 54.3 kg (119 lb 12.8 oz)      ASSESSMENT AND PLAN:  # Hyperlipidemia:  Goal LDL <70.  She had myalgias on atorvastatin at higher doses.  We will get her lipids from her PCP.  # Hypertension: Blood pressure well-controlled off antihypertensives.   # Carotid stenosis:  1-39% stenosis bilaterally.  Repeat carotid Dopplers at follow up. Continue atorvastatin and Plavix.      Current medicines are reviewed at length with the patient today.  The patient does not have concerns regarding medicines.  The following changes have been made:  none Labs/ tests ordered today include:  No orders of the defined types were placed in this encounter.    Disposition:   FU with Kyre Jeffries C. Duke Salviaandolph, MD, [facility] in 1 year   This note was written with the assistance of speech recognition software.  Please excuse any transcriptional errors.  Signed, Theadora Noyes C. Duke Salviaandolph, MD, [facility]  12/14/2016  3:38 PM    [facility]

## 2016-12-14 NOTE — Patient Instructions (Signed)

## 2016-12-20 ENCOUNTER — Telehealth: Payer: Self-pay | Admitting: Cardiovascular Disease

## 2016-12-20 NOTE — Telephone Encounter (Signed)
ROI faxed to Auto-Owners InsuranceDonald Bulla, PA-C. Ab  [facility] Forwarded records to Dr. Leonides Sakeandolph's box. 12/21/16/ab

## 2017-01-05 ENCOUNTER — Telehealth: Payer: Self-pay | Admitting: *Deleted

## 2017-01-05 DIAGNOSIS — Z79899 Other long term (current) drug therapy: Secondary | ICD-10-CM

## 2017-01-05 DIAGNOSIS — E785 Hyperlipidemia, unspecified: Secondary | ICD-10-CM

## 2017-01-05 DIAGNOSIS — I10 Essential (primary) hypertension: Secondary | ICD-10-CM

## 2017-01-05 MED ORDER — ATORVASTATIN CALCIUM 20 MG PO TABS: 20.0000 mg | ORAL_TABLET | Freq: Every day | ORAL | 5 refills | Status: DC

## 2017-01-05 NOTE — Telephone Encounter (Signed)
Advised patient of lab results and medications changes Mailed lab orders for recheck in 6 weeks

## 2017-01-05 NOTE — Telephone Encounter (Signed)
-----   Message from Chilton Si, MD sent at 12/28/2016  9:16 AM EDT ----- Cholesterol levels are pretty good. However, given her PAD her LDL should be less than 70. Increase atorvastatin to 20 mg. Repeat lipids and CMP in 6 weeks.

## 2017-05-02 ENCOUNTER — Ambulatory Visit (INDEPENDENT_AMBULATORY_CARE_PROVIDER_SITE_OTHER): Payer: Medicare Other | Admitting: Family

## 2017-05-02 ENCOUNTER — Ambulatory Visit (HOSPITAL_COMMUNITY)
Admission: RE | Admit: 2017-05-02 | Discharge: 2017-05-02 | Disposition: A | Discharge status: Home / Self Care | Payer: Medicare Other | Source: Ambulatory Visit | Attending: Vascular Surgery | Admitting: Vascular Surgery

## 2017-05-02 ENCOUNTER — Encounter: Payer: Self-pay | Admitting: Family

## 2017-05-02 VITALS — BP 99/48 | HR 99 | Temp 96.7°F | Resp 18 | Wt 141.0 lb

## 2017-05-02 DIAGNOSIS — E785 Hyperlipidemia, unspecified: Secondary | ICD-10-CM | POA: Diagnosis not present

## 2017-05-02 DIAGNOSIS — I779 Disorder of arteries and arterioles, unspecified: Secondary | ICD-10-CM | POA: Diagnosis not present

## 2017-05-02 DIAGNOSIS — Z95828 Presence of other vascular implants and grafts: Secondary | ICD-10-CM | POA: Insufficient documentation

## 2017-05-02 DIAGNOSIS — R0989 Other specified symptoms and signs involving the circulatory and respiratory systems: Secondary | ICD-10-CM | POA: Diagnosis present

## 2017-05-02 DIAGNOSIS — Z87891 Personal history of nicotine dependence: Secondary | ICD-10-CM | POA: Insufficient documentation

## 2017-05-02 NOTE — Patient Instructions (Signed)

## 2017-05-02 NOTE — Progress Notes (Signed)
[facility] [place]   CC: Follow up peripheral artery occlusive disease  History of Present Illness Sylvia Rodriguez is a 71 y.o. female returns for follow-up of aortobifemoral bypass on 04-04-16 by Dr. Arbie CookeyEarly for severe aortoiliac occlusive disease.   She is very active in her occupation and has been very health conscious since her surgery. She has been very compliant in her diet. She denies any claudication symptoms with walking.   Dr. Arbie CookeyEarly last evaluated pt on 07-26-16. At that time noninvasive studies revealed ankle arm index normal bilaterally at 1.1 with biphasic waveforms. Excellent recovery following aortobifemoral bypass. She was released to return to full activity without limitation. Pt was to follow up in 9 months for one-year follow-up and repeat ankle arm indices. Assuming these are normal and she is asymptomatic we'll discontinue ongoing follow-up.  She sees a podiatrist on a regular basis. Pt c/o right foot pain, tingling, and numbness, tender to touch that started about the Summer of 2017, has progressively worsened. She fractured her right ankle in the 1980's, had an ORIF at that time.   She denies any known history of stroke or TIA.    Diabetic: No Tobacco use: former smoker, quit in 2012 years ago  Pt meds include: Statin :Yes Betablocker: No ASA: No Other anticoagulants/antiplatelets: Plavix  Past Medical History:  Diagnosis Date  . Arthritis   . Carotid stenosis 06/17/2016   1-39% 02/2016  . Complication of anesthesia    trouble waking up after colonoscopy  . Essential hypertension 02/19/2016  . History of pneumonia    "way back"  . Hyperlipidemia 02/19/2016  . Peripheral arterial disease (HCC) 02/19/2016    Social History Social History   Tobacco Use  . Smoking status: Former Smoker    Last attempt to quit: 01/19/2011    Years since quitting: 6.2  . Smokeless tobacco: Never Used  Substance Use Topics  . Alcohol use: No  . Drug  use: No    Family History Family History  Problem Relation Age of Onset  . Diabetes Mother   . Heart attack Mother   . Valvular heart disease Mother   . Stroke Maternal Aunt   . Heart attack Maternal Uncle     Past Surgical History:  Procedure Laterality Date  . ANKLE SURGERY     broke ankle in 3 places  . AORTA - BILATERAL FEMORAL ARTERY BYPASS GRAFT Bilateral 04/04/2016   Procedure: AORTOBIFEMORAL BYPASS GRAFT;  Surgeon: Larina Earthlyodd F Early, MD;  [facility];  Service: Vascular;  Laterality: Bilateral;  . APPENDECTOMY    . CATARACT EXTRACTION W/ INTRAOCULAR LENS IMPLANT Bilateral   . COLONOSCOPY    . CYST REMOVAL HAND    . OOPHORECTOMY    . PERIPHERAL VASCULAR CATHETERIZATION N/A 01/27/2016   Procedure: Abdominal Aortogram w/Lower Extremity;  Surgeon: Maeola HarmanBrandon Christopher Cain, MD;  [facility];  Service: Cardiovascular;  Laterality: N/A;  . TONSILLECTOMY      Allergies  Allergen Reactions  . Penicillins Hives     Has patient had a PCN reaction causing immediate rash, facial/tongue/throat swelling, SOB or lightheadedness with hypotension:  # # YES # #  Has patient had a PCN reaction causing severe rash involving mucus membranes or skin necrosis: No Has patient had a PCN reaction that required hospitalization No Has patient had a PCN reaction occurring within the last 10 years: No If all of the above answers are "NO", then may proceed with Cephalosporin use.    .Marland Kitchen  Aspirin Nausea And Vomiting  . Codeine Other (See Comments)    Bone Pain    Current Outpatient Medications  Medication Sig Dispense Refill  . atorvastatin (LIPITOR) 20 MG tablet Take 1 tablet (20 mg total) by mouth at bedtime. 30 tablet 5  . clopidogrel (PLAVIX) 75 MG tablet Take 75 mg by mouth daily.    Marland Kitchen omeprazole (PRILOSEC) 40 MG capsule Take 40 mg by mouth daily.     No current facility-administered medications for this visit.     ROS: See HPI for pertinent positives and  negatives.   Physical Examination  Vitals:   05/02/17 1337 05/02/17 1340  BP: (!) 86/50 (!) 99/48  Pulse: 99   Resp: 18   Temp: (!) 96.7 F (35.9 C)   TempSrc: Oral   SpO2: 95%   Weight: 141 lb (64 kg)    Body mass index is 25.79 kg/m.  General: A&O x 3, WDWN, female. Gait: normal Neck: Acanthosis nigricans at left side and posterior aspect of neck Eyes: PERRLA. Pulmonary: Respirations are non labored, CTAB, good air movement Cardiac: regular rhythm and rate, no detected murmur.         Carotid Bruits Right Left   Positive Negative   Radial pulses are 1+ palpable bilaterally   Adominal aortic pulse is midly palpable                         VASCULAR EXAM: Extremities without ischemic changes, without Gangrene; without open wounds.                                                                                                          LE Pulses Right Left       FEMORAL  2+ palpable  2+ palpable        POPLITEAL  not palpable   not palpable       POSTERIOR TIBIAL  not palpable   not palpable        DORSALIS PEDIS      ANTERIOR TIBIAL 1+ palpable  1+ palpable    Abdomen: soft, NT, no palpable masses. Skin: no rashes, no ulcers noted. Musculoskeletal: no muscle wasting or atrophy.  Neurologic: A&O X 3; Appropriate Affect, talkative/anxious; SENSATION: normal; MOTOR FUNCTION:  moving all extremities equally, motor strength 5/5 throughout. Speech is fluent/normal. CN 2-12 intact.    ASSESSMENT: Sylvia Rodriguez is a 71 y.o. female who is s/p aortobifemoral bypass on 04-04-16.  She has no claudication with walking. There are no signs of ischemia in her feet or legs.   There is a right carotid bruit, pt has no hx of stroke or TIA. Carotid duplex obtained by her cardiologist in October 2017 demonstrates 1-39% stenosis in bilateral ICA.   She does have progressive tingling, numbness, and pain at the plantar aspect of her right forefoot.  Pt states her mother  had DM. Pt has no hx of DM, but does have Acanthosis nigricans on left side and posterior aspect of neck which is a sign of  insulin resistance. We discussed at length lifestyle measures to prevent or delay the onset of Type 2 DM.     DATA  ABI (Date: 05/02/2017):  R:   ABI: 1.03 (was 1.10 on 07-26-16),   PT: waveform morphology not documented  DP: waveform morphology not documented  TBI:  0.89  L:   ABI: 0.98 (was 1.10),   PT: waveform morphology not documented  DP: waveform morphology not documented  TBI: 0.83  Normal bilateral ABI and TBI.    Carotid Duplex requested by Dr. Chilton Siiffany Gordon (02-25-16): Heterogeneous plaque, bilaterally. 1-39% bilateral ICA stenosis. >50% LECA stenosis. Normal subclavian arteries, bilaterally. Patent vertebral arteries with antegrade flow.   PLAN:  I advised her to walk at least 30 minutes daily in a safe environment.  Based on the patient's vascular studies and examination, pt will return to clinic as needed.   I discussed in depth with the patient the nature of atherosclerosis, and emphasized the importance of maximal medical management including strict control of blood pressure, blood glucose, and lipid levels, obtaining regular exercise, and continued cessation of smoking.  The patient is aware that without maximal medical management the underlying atherosclerotic disease process will progress, limiting the benefit of any interventions.  The patient was given information about PAD including signs, symptoms, treatment, what symptoms should prompt the patient to seek immediate medical care, and risk reduction measures to take.  Charisse MarchSuzanne Nickel, RN, MSN, FNP-C [facility] [place] Office Phone: [phone]  Clinic MD: Early  05/02/17 2:10 PM

## 2017-05-29 ENCOUNTER — Other Ambulatory Visit: Payer: Self-pay

## 2017-05-29 MED ORDER — ATORVASTATIN CALCIUM 20 MG PO TABS: 20.0000 mg | ORAL_TABLET | Freq: Every day | ORAL | 2 refills | Status: DC

## 2017-05-29 NOTE — Telephone Encounter (Signed)
Rx(s) sent to pharmacy electronically.  

## 2017-06-28 ENCOUNTER — Telehealth: Payer: Self-pay | Admitting: Cardiovascular Disease

## 2017-06-28 NOTE — Telephone Encounter (Signed)
New Message   Patient is complaining about a pain that she is having in her neck. She has been experiencing the pain for the past 3 weeks. Please call to discuss.

## 2017-06-28 NOTE — Telephone Encounter (Signed)
Spoke with patient and she has been having right sided neck pain for last several weeks that feels like "pinching" pain. She has been under a lot of stress, her uncle passed away in December. Advised patient to contact PCP, verbalized understanding.

## 2017-06-28 NOTE — Telephone Encounter (Signed)
Agree 

## 2017-08-14 ENCOUNTER — Other Ambulatory Visit: Payer: Self-pay | Admitting: Cardiovascular Disease

## 2017-08-14 DIAGNOSIS — I6523 Occlusion and stenosis of bilateral carotid arteries: Secondary | ICD-10-CM

## 2017-08-21 ENCOUNTER — Encounter (HOSPITAL_COMMUNITY): Payer: Medicare Other

## 2017-08-22 ENCOUNTER — Ambulatory Visit (HOSPITAL_COMMUNITY)
Admission: RE | Admit: 2017-08-22 | Discharge: 2017-08-22 | Disposition: A | Discharge status: Home / Self Care | Payer: Medicare Other | Source: Ambulatory Visit | Attending: Cardiology | Admitting: Cardiology

## 2017-08-22 DIAGNOSIS — I10 Essential (primary) hypertension: Secondary | ICD-10-CM | POA: Diagnosis not present

## 2017-08-22 DIAGNOSIS — Z87891 Personal history of nicotine dependence: Secondary | ICD-10-CM | POA: Diagnosis not present

## 2017-08-22 DIAGNOSIS — E785 Hyperlipidemia, unspecified: Secondary | ICD-10-CM | POA: Insufficient documentation

## 2017-08-22 DIAGNOSIS — I6523 Occlusion and stenosis of bilateral carotid arteries: Secondary | ICD-10-CM | POA: Diagnosis present

## 2017-08-24 ENCOUNTER — Ambulatory Visit (INDEPENDENT_AMBULATORY_CARE_PROVIDER_SITE_OTHER): Payer: Medicare Other | Admitting: Cardiovascular Disease

## 2017-08-24 ENCOUNTER — Encounter: Payer: Self-pay | Admitting: Cardiovascular Disease

## 2017-08-24 VITALS — BP 102/60 | HR 87 | Ht 61.0 in | Wt 138.0 lb

## 2017-08-24 DIAGNOSIS — E785 Hyperlipidemia, unspecified: Secondary | ICD-10-CM

## 2017-08-24 DIAGNOSIS — I6523 Occlusion and stenosis of bilateral carotid arteries: Secondary | ICD-10-CM

## 2017-08-24 DIAGNOSIS — I10 Essential (primary) hypertension: Secondary | ICD-10-CM

## 2017-08-24 NOTE — Progress Notes (Signed)
Cardiology Office Note   Date:  08/30/2017   ID:  Sylvia Rodriguez, DOB 1946-03-24, MRN [id number]  PCP:  Doreen SalvageBulla, Donald, PA-C  Cardiologist:   Chilton Siiffany [place], MD   Chief Complaint  Patient presents with  . Follow-up     History of Present Illness: Sylvia Rodriguez is a 72 y.o. female with hypertension, hyperlipidemia, mild carotid stenosis, and PAD who presents for follow up.  Sylvia Rodriguez was first seen 02/2016 for presurgical risk assessment prior to aortobifemoral bypass.  At the time she denied exertional dyspnea or chest pain. She was felt to be at acceptable risk to pursue surgery. She underwent aortobifemoral bypass on 04/04/16. She did report dizziness and her blood pressure was consistently in the 90s over 40s. Therefore her amlodipine and lisinopril were discontinued. She was noted to have a right carotid bruit.  Carotid Dopplers 02/25/16 showed 1-39% ICA stenosis bilaterally.    At her last appointment Sylvia Rodriguez was doing well.  Since that time she fell and broke her collar bone.  Since then she has struggled with pain in the back of her neck.  She had carotid Dopplers 08/2017 that revealed 1-39% ICA stenosis bilaterally.  She has been working on her diet and is losing weight.  She has been walking for exercise and has no chest pain or shortness of breath with that activity.  She denies lower extremity edema, orthopnea or PND.    Past Medical History:  Diagnosis Date  . Arthritis   . Carotid stenosis 06/17/2016   1-39% 02/2016  . Complication of anesthesia    trouble waking up after colonoscopy  . Essential hypertension 02/19/2016  . History of pneumonia    "way back"  . Hyperlipidemia 02/19/2016  . Peripheral arterial disease (HCC) 02/19/2016    Past Surgical History:  Procedure Laterality Date  . ANKLE SURGERY     broke ankle in 3 places  . AORTA - BILATERAL FEMORAL ARTERY BYPASS GRAFT Bilateral 04/04/2016   Procedure: AORTOBIFEMORAL BYPASS GRAFT;  Surgeon: Larina Earthlyodd  F Early, MD;  [facility];  Service: Vascular;  Laterality: Bilateral;  . APPENDECTOMY    . CATARACT EXTRACTION W/ INTRAOCULAR LENS IMPLANT Bilateral   . COLONOSCOPY    . CYST REMOVAL HAND    . OOPHORECTOMY    . PERIPHERAL VASCULAR CATHETERIZATION N/A 01/27/2016   Procedure: Abdominal Aortogram w/Lower Extremity;  Surgeon: Maeola HarmanBrandon Christopher Cain, MD;  [facility];  Service: Cardiovascular;  Laterality: N/A;  . TONSILLECTOMY       Current Outpatient Medications  Medication Sig Dispense Refill  . atorvastatin (LIPITOR) 20 MG tablet Take 1 tablet (20 mg total) by mouth at bedtime. 90 tablet 2  . clopidogrel (PLAVIX) 75 MG tablet Take 75 mg by mouth daily.    Marland Kitchen. omeprazole (PRILOSEC) 40 MG capsule Take 40 mg by mouth daily.     No current facility-administered medications for this visit.     Allergies:   Penicillins; Aspirin; and Codeine    Social History:  The patient  reports that she quit smoking about 6 years ago. She has never used smokeless tobacco. She reports that she does not drink alcohol or use drugs.   Family History:  The patient's family history includes Diabetes in her mother; Heart attack in her maternal uncle and mother; Stroke in her maternal aunt; Valvular heart disease in her mother.    ROS:  Please see the history of present illness.   Otherwise, review of  systems are positive for none.   All other systems are reviewed and negative.    PHYSICAL EXAM: VS:  BP 102/60   Pulse 87   Ht 5\' 1"  (1.549 m)   Wt 138 lb (62.6 kg)   BMI 26.07 kg/m  , BMI Body mass index is 26.07 kg/m. GENERAL:  Well appearing.  No acute distress HEENT:  Pupils equal round and reactive, fundi not visualized, oral mucosa unremarkable NECK:  No jugular venous distention, waveform within normal limits, carotid upstroke brisk and symmetric, R carotid bruit LUNGS:  Clear to auscultation bilaterally.  No crackles, wheezes, or rhonchi. HEART:  RRR.  PMI not displaced or  sustained,S1 and S2 within normal limits, no S3, no S4, no clicks, no rubs, no murmurs ABD:  Flat, positive bowel sounds normal in frequency in pitch, no bruits, no rebound, no guarding, no midline pulsatile mass, no hepatomegaly, no splenomegaly EXT:  2 plus pulses throughout, trace L pedal edema, no cyanosis no clubbing SKIN:  No rashes no nodules NEURO:  Cranial nerves II through XII grossly intact, motor grossly intact throughout PSYCH:  Cognitively intact, oriented to person place and time   EKG:  EKG is ordered today. The ekg ordered 01/27/16 demonstrates sinus rhythm rate 73 bpm.  12/14/16: Sinus rhythm. Rate 99 bpm. Extremity 08/24/17: Sinus rhythm.  Rate 87 bpm.    Carotid Doppler 02/25/16:1-39% ICA stenosis bilaterally.  Recent Labs: No results found for requested labs within last 8760 hours.    Lipid Panel No results found for: CHOL, TRIG, HDL, CHOLHDL, VLDL, LDLCALC, LDLDIRECT    Wt Readings from Last 3 Encounters:  08/24/17 138 lb (62.6 kg)  05/02/17 141 lb (64 kg)  12/14/16 131 lb (59.4 kg)      ASSESSMENT AND PLAN:  # Hyperlipidemia:  Goal LDL <70.  She had myalgias on atorvastatin at higher doses.  Her LDL was elevated in July and she admits to having some dietary indiscretions.  She will work on this and repeat lipids in July.  If she is still above goal we will need to consider switching statins or trying a PCSK 9 inhibitor.  # Hypertension: Blood pressure well-controlled off antihypertensives.   # Carotid stenosis:  Caoitid Dopplers unchanged on clopidogrel and atorvastatin.  1-39% stenosis bilaterally 08/2017.  Repeat 08/2019.    Current medicines are reviewed at length with the patient today.  The patient does not have concerns regarding medicines.  The following changes have been made:  None  Labs/ tests ordered today include:   Orders Placed This Encounter  Procedures  . EKG 12-Lead    Disposition:   FU with Sylvia Vandervort C. Duke Salvia, MD, [facility] in 1  year    Signed, Sylvia Bissonnette C. Duke Salvia, MD, [facility]  08/30/2017 5:30 PM    [facility]

## 2017-08-24 NOTE — Patient Instructions (Signed)
Medication Instructions:  No medication changes    Follow-Up: Your physician wants you to follow-up in: 12 months You will receive a reminder letter in the mail two months in advance. If you don't receive a letter, please call our office to schedule the follow-up appointment.   Any Other Special Instructions Will Be Listed Below (If Applicable).     If you need a refill on your cardiac medications before your next appointment, please call your pharmacy.

## 2017-08-30 ENCOUNTER — Encounter: Payer: Self-pay | Admitting: Cardiovascular Disease

## 2018-02-14 ENCOUNTER — Other Ambulatory Visit: Payer: Self-pay | Admitting: Cardiovascular Disease

## 2018-08-06 ENCOUNTER — Other Ambulatory Visit: Payer: Self-pay | Admitting: Cardiovascular Disease

## 2018-09-11 ENCOUNTER — Telehealth: Payer: Self-pay | Admitting: Cardiovascular Disease

## 2018-09-11 NOTE — Telephone Encounter (Signed)
Mychart pending, smartphone, pre reg complete 09/11/18 AF

## 2018-09-12 ENCOUNTER — Telehealth (INDEPENDENT_AMBULATORY_CARE_PROVIDER_SITE_OTHER): Payer: Medicare Other | Admitting: Cardiovascular Disease

## 2018-09-12 DIAGNOSIS — I6523 Occlusion and stenosis of bilateral carotid arteries: Secondary | ICD-10-CM

## 2018-09-12 DIAGNOSIS — E785 Hyperlipidemia, unspecified: Secondary | ICD-10-CM

## 2018-09-12 DIAGNOSIS — I7409 Other arterial embolism and thrombosis of abdominal aorta: Secondary | ICD-10-CM

## 2018-09-12 NOTE — Patient Instructions (Addendum)
Medication Instructions:  Your physician recommends that you continue on your current medications as directed. Please refer to the Current Medication list given to you today.  If you need a refill on your cardiac medications before your next appointment, please call your pharmacy.   Lab work: Engineer, manufacturing OF LABS FROM PRIMARY CARE  Testing/Procedures: Your physician has requested that you have a carotid duplex. This test is an ultrasound of the carotid arteries in your neck. It looks at blood flow through these arteries that supply the brain with blood. Allow one hour for this exam. There are no restrictions or special instructions. April 2021  Follow-Up: At [facility], you and your health needs are our priority.  As part of our continuing mission to provide you with exceptional heart care, we have created designated Provider Care Teams.  These Care Teams include your primary Cardiologist (physician) and Advanced Practice Providers (APPs -  Physician Assistants and Nurse Practitioners) who all work together to provide you with the care you need, when you need it. You will need a follow up appointment in 12 months.  Please call our office 2 months in advance to schedule this appointment.  You may see Chilton Si, MD or one of the following Advanced Practice Providers on your designated Care Team:   Corine Shelter, PA-C Judy Pimple, [place] . Marjie Skiff, PA-C

## 2018-09-12 NOTE — Progress Notes (Signed)
Virtual Visit via Telephone Note   This visit type was conducted due to national recommendations for restrictions regarding the COVID-19 Pandemic (e.g. social distancing) in an effort to limit this patient's exposure and mitigate transmission in our community.  Due to her co-morbid illnesses, this patient is at least at moderate risk for complications without adequate follow up.  This format is felt to be most appropriate for this patient at this time.  The patient did not have access to video technology/had technical difficulties with video requiring transitioning to audio format only (telephone).  All issues noted in this document were discussed and addressed.  No physical exam could be performed with this format.  Please refer to the patient's chart for her  consent to telehealth [facility].   Evaluation Performed:  Follow-up visit  Date:  09/12/2018   ID:  Sylvia Rodriguez, DOB July 18, 1945, MRN [id number]  Patient Location: Home Provider Location: Office  PCP:  Doreen SalvageBulla, Donald, PA-C  Cardiologist:  Chilton Siiffany [place], MD  Electrophysiologist:  None   Chief Complaint:  Follow up  History of Present Illness:    Sylvia Rodriguez is a 73 y.o. female with hypertension, hyperlipidemia, mild carotid stenosis, and PAD who presents for follow up.  Sylvia Rodriguez was first seen 02/2016 for presurgical risk assessment prior to aortobifemoral bypass.  At the time she denied exertional dyspnea or chest pain. She was felt to be at acceptable risk to pursue surgery. She underwent aortobifemoral bypass on 04/04/16. She did report dizziness and her blood pressure was consistently in the 90s over 40s. Therefore her amlodipine and lisinopril were discontinued. She was noted to have a right carotid bruit.  Carotid Dopplers 02/25/16 showed 1-39% ICA stenosis bilaterally and were unchanged 08/2017.  Since her last appointment Sylvia Rodriguez has been doing well.  She hasn't had any chest pain or shortness of  breath.  She denies claudication, lower extremity edema, orthopnea or PND.  She hasn't been getting much exercise but is active at work.  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  Since her last appointment she became a vegan which has helped to keep her weight down.  Her weigt has been stable around 135 lb.  She is unable to check her blood pressure.  She had an appointment with her PCP last week and labs were checked at that time.   The patient does not have symptoms concerning for COVID-19 infection (fever, chills, cough, or new shortness of breath).    Past Medical History:  Diagnosis Date  . Arthritis   . Carotid stenosis 06/17/2016   1-39% 02/2016  . Complication of anesthesia    trouble waking up after colonoscopy  . Essential hypertension 02/19/2016  . History of pneumonia    "way back"  . Hyperlipidemia 02/19/2016  . Peripheral arterial disease (HCC) 02/19/2016   Past Surgical History:  Procedure Laterality Date  . ANKLE SURGERY     broke ankle in 3 places  . AORTA - BILATERAL FEMORAL ARTERY BYPASS GRAFT Bilateral 04/04/2016   Procedure: AORTOBIFEMORAL BYPASS GRAFT;  Surgeon: Larina Earthlyodd F Early, MD;  [facility];  Service: Vascular;  Laterality: Bilateral;  . APPENDECTOMY    . CATARACT EXTRACTION W/ INTRAOCULAR LENS IMPLANT Bilateral   . COLONOSCOPY    . CYST REMOVAL HAND    . OOPHORECTOMY    . PERIPHERAL VASCULAR CATHETERIZATION N/A 01/27/2016   Procedure: Abdominal Aortogram w/Lower Extremity;  Surgeon: Maeola HarmanBrandon Christopher Cain, MD;  [facility];  Service:  Cardiovascular;  Laterality: N/A;  . TONSILLECTOMY       Current Meds  Medication Sig  . atorvastatin (LIPITOR) 20 MG tablet TAKE 1 TABLET BY MOUTH EVERYDAY AT BEDTIME  . clopidogrel (PLAVIX) 75 MG tablet Take 75 mg by mouth daily.  . pantoprazole (PROTONIX) 20 MG tablet Take 1 tablet by mouth daily.  . [DISCONTINUED] omeprazole (PRILOSEC) 40 MG capsule Take 40 mg by mouth daily.     Allergies:   Penicillins;  Aspirin; and Codeine   Social History   Tobacco Use  . Smoking status: Former Smoker    Last attempt to quit: 01/19/2011    Years since quitting: 7.6  . Smokeless tobacco: Never Used  Substance Use Topics  . Alcohol use: No  . Drug use: No     Family Hx: The patient's family history includes Diabetes in her mother; Heart attack in her maternal uncle and mother; Stroke in her maternal aunt; Valvular heart disease in her mother.  ROS:   Please see the history of present illness.     All other systems reviewed and are negative.   Prior CV studies:   The following studies were reviewed today:  Carotid Doppler 08/2017: Final Interpretation: Right Carotid: Velocities in the right ICA are consistent with a 1-39% stenosis.                Non-hemodynamically significant plaque <50% noted in the CCA.  Left Carotid: Velocities in the left ICA are consistent with a 1-39% stenosis.               Non-hemodynamically significant plaque noted in the CCA. The ECA               appears >50% stenosed.  Vertebrals:  Bilateral vertebral arteries demonstrate antegrade flow. Subclavians: Normal flow hemodynamics were seen in bilateral subclavian              arteries.    Labs/Other Tests and Data Reviewed:    EKG:  No ECG reviewed.  Recent Labs: No results found for requested labs within last 8760 hours.   Recent Lipid Panel No results found for: CHOL, TRIG, HDL, CHOLHDL, LDLCALC, LDLDIRECT  Wt Readings from Last 3 Encounters:  08/24/17 138 lb (62.6 kg)  05/02/17 141 lb (64 kg)  12/14/16 131 lb (59.4 kg)     Objective:    There were no vitals taken for this visit. GENERAL: Well-appearing.  No acute distress. HEENT: Pupils equal round.  Oral mucosa unremarkable NECK:  No jugular venous distention, no visible thyromegaly EXT:  No edema, no cyanosis no clubbing SKIN:  No rashes no nodules NEURO:  Speech fluent.  Cranial nerves grossly intact.  Moves all 4 extremities freely  PSYCH:  Cognitively intact, oriented to person place and time   ASSESSMENT & PLAN:    # Hyperlipidemia:  Goal LDL <70.  She had myalgias on atorvastatin at higher doses.  Lipids were recently checked with her PCP.  We will get a copy.  If she is still above goal we will need to consider switching statins or trying a PCSK 9 inhibitor.  # Hypertension: Blood pressure well-controlled off antihypertensives.   # Carotid stenosis:  Caoitid Dopplers unchanged on clopidogrel and atorvastatin.  1-39% stenosis bilaterally 08/2017.  Repeat 08/2019.    COVID-19 Education: The signs and symptoms of COVID-19 were discussed with the patient and how to seek care for testing (follow up with PCP or arrange E-visit).  The importance of social  distancing was discussed today.  Time:   Today, I have spent 16 minutes with the patient with telehealth technology discussing the above problems.     Medication Adjustments/Labs and Tests Ordered: Current medicines are reviewed at length with the patient today.  Concerns regarding medicines are outlined above.   Tests Ordered: No orders of the defined types were placed in this encounter.   Medication Changes: No orders of the defined types were placed in this encounter.   Disposition:  Follow up in 1 year(s)  Signed, Chilton Si, MD  09/12/2018 4:52 PM    [facility]

## 2018-11-13 IMAGING — CR DG CHEST 1V PORT
1 series · 1 of 1 positions shown · non-contrast
Comparison: Limited views of the chest from a thoracic spine series
of [DATE][REDACTED] 6290

CLINICAL DATA: Status post aortobifemoral bypass surgery.

EXAM:
PORTABLE CHEST 1 VIEW

[AP]
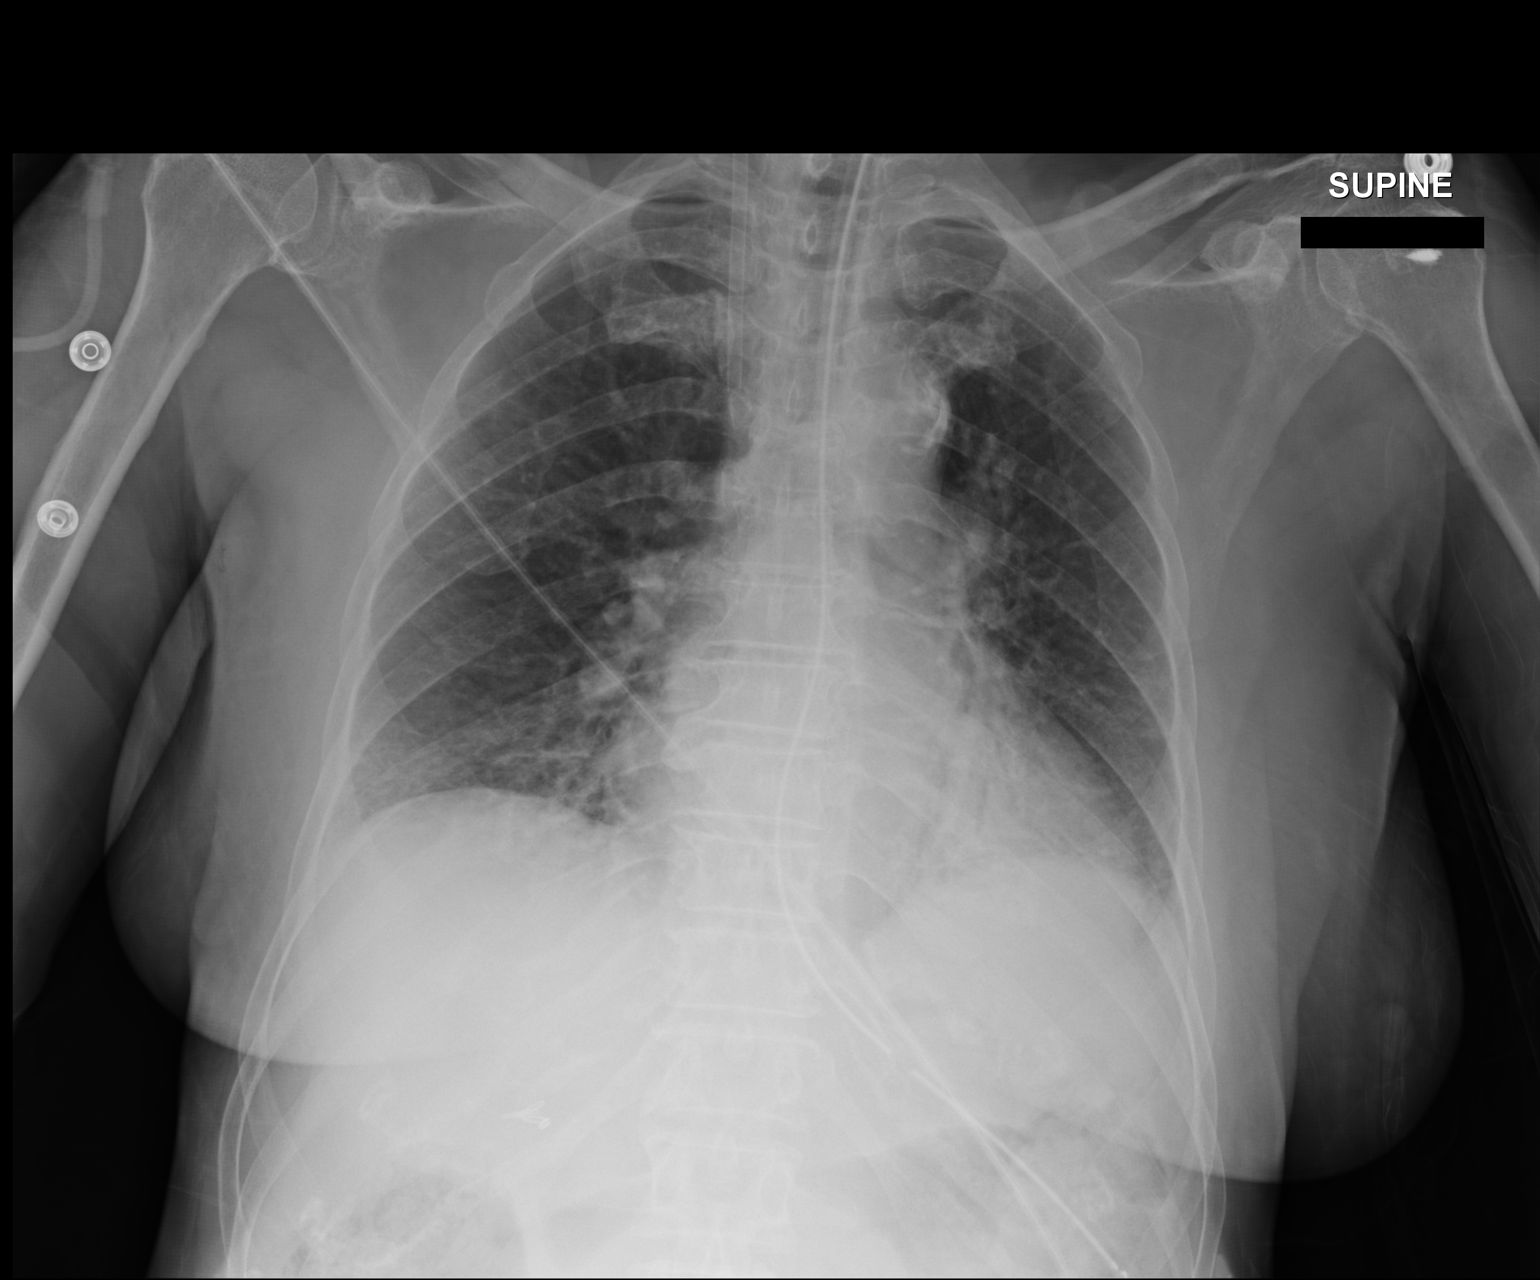

[1 of 1 positions shown; findings below may reference images not displayed]

FINDINGS: The lungs are reasonably well inflated. There is left lower lobe
atelectasis or infiltrate which is stable. The cardiac silhouette is
mildly enlarged. The pulmonary vascularity is not engorged. There is
no pneumothorax nor large pleural effusion. There is dense
calcification in the wall of the aortic arch. The esophagogastric
tube tip projects in the gastric body in the proximal port below the
GE junction. The right internal jugular Cordis sheath tip projects
over the proximal SVC.
IMPRESSION: Mild interstitial prominence bilaterally not greatly changed from
previous study. Left lower lobe density also fairly stable which
likely reflects chronic scarring/ bronchiectasis or atelectasis
though superimposed pneumonia is not excluded.

Thoracic aortic atherosclerosis.

## 2018-12-29 ENCOUNTER — Other Ambulatory Visit: Payer: Self-pay | Admitting: Cardiovascular Disease

## 2019-08-26 ENCOUNTER — Inpatient Hospital Stay (HOSPITAL_COMMUNITY): Admission: RE | Admit: 2019-08-26 | Payer: Medicare Other | Source: Ambulatory Visit

## 2019-08-29 ENCOUNTER — Ambulatory Visit (HOSPITAL_COMMUNITY)
Admission: RE | Admit: 2019-08-29 | Discharge: 2019-08-29 | Disposition: A | Discharge status: Home / Self Care | Payer: Medicare Other | Source: Ambulatory Visit | Attending: Cardiology | Admitting: Cardiology

## 2019-08-29 ENCOUNTER — Other Ambulatory Visit: Payer: Self-pay

## 2019-08-29 DIAGNOSIS — I6523 Occlusion and stenosis of bilateral carotid arteries: Secondary | ICD-10-CM | POA: Insufficient documentation

## 2019-09-02 ENCOUNTER — Other Ambulatory Visit: Payer: Self-pay | Admitting: *Deleted

## 2019-09-02 DIAGNOSIS — I6523 Occlusion and stenosis of bilateral carotid arteries: Secondary | ICD-10-CM

## 2019-12-15 DEATH — deceased
